# Patient Record
Sex: Male | Born: 1985 | Race: Black or African American | Hispanic: No | Marital: Single | Smoking: Former smoker
Health system: Southern US, Community
[De-identification: ages and names within clinical notes are randomized; demographics above are authoritative.]

## PROBLEM LIST (undated history)

## (undated) DIAGNOSIS — J45909 Unspecified asthma, uncomplicated: Secondary | ICD-10-CM

## (undated) DIAGNOSIS — M1990 Unspecified osteoarthritis, unspecified site: Secondary | ICD-10-CM

## (undated) DIAGNOSIS — S83249A Other tear of medial meniscus, current injury, unspecified knee, initial encounter: Secondary | ICD-10-CM

## (undated) HISTORY — PX: NO PAST SURGERIES: SHX2092

---

## 2010-12-01 ENCOUNTER — Emergency Department (HOSPITAL_COMMUNITY)
Admission: EM | Admit: 2010-12-01 | Discharge: 2010-12-02 | Payer: Self-pay | Source: Home / Self Care | Admitting: Emergency Medicine

## 2010-12-02 IMAGING — CR DG ANKLE COMPLETE 3+V*L*
3 series · 3 of 3 positions shown · non-contrast
Comparison: None.

CLINICAL DATA: Twisted ankle, with medial and lateral left ankle
pain and swelling.

LEFT ANKLE COMPLETE - 3+ VIEW

[t ankle joint ap left]
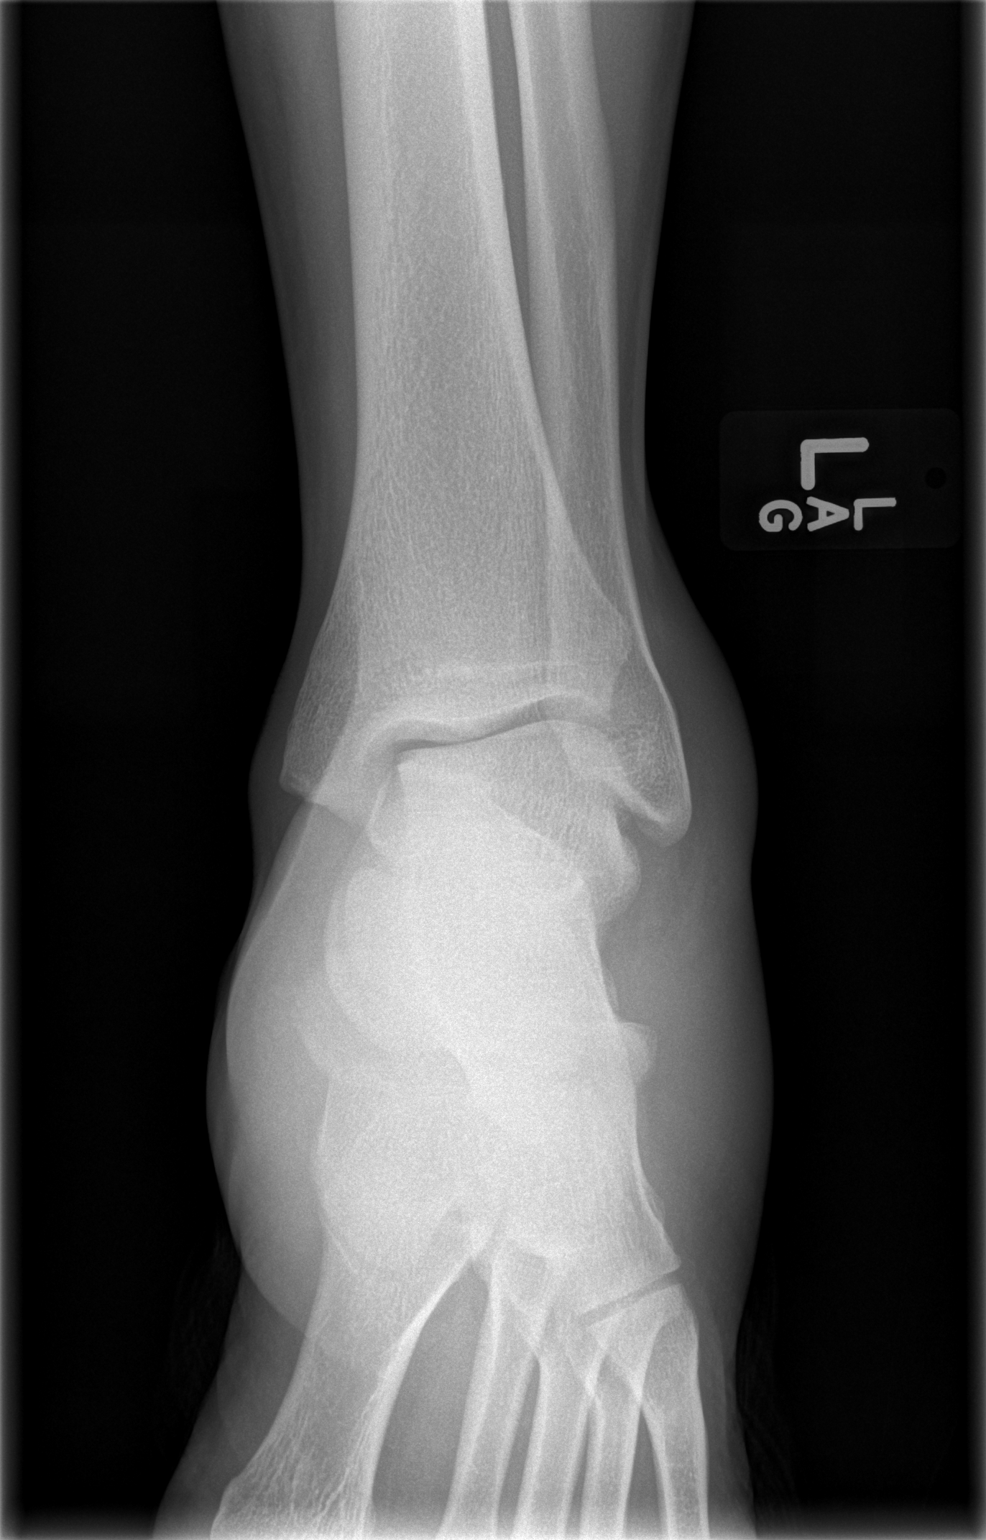

[t ankle joint oblique left]
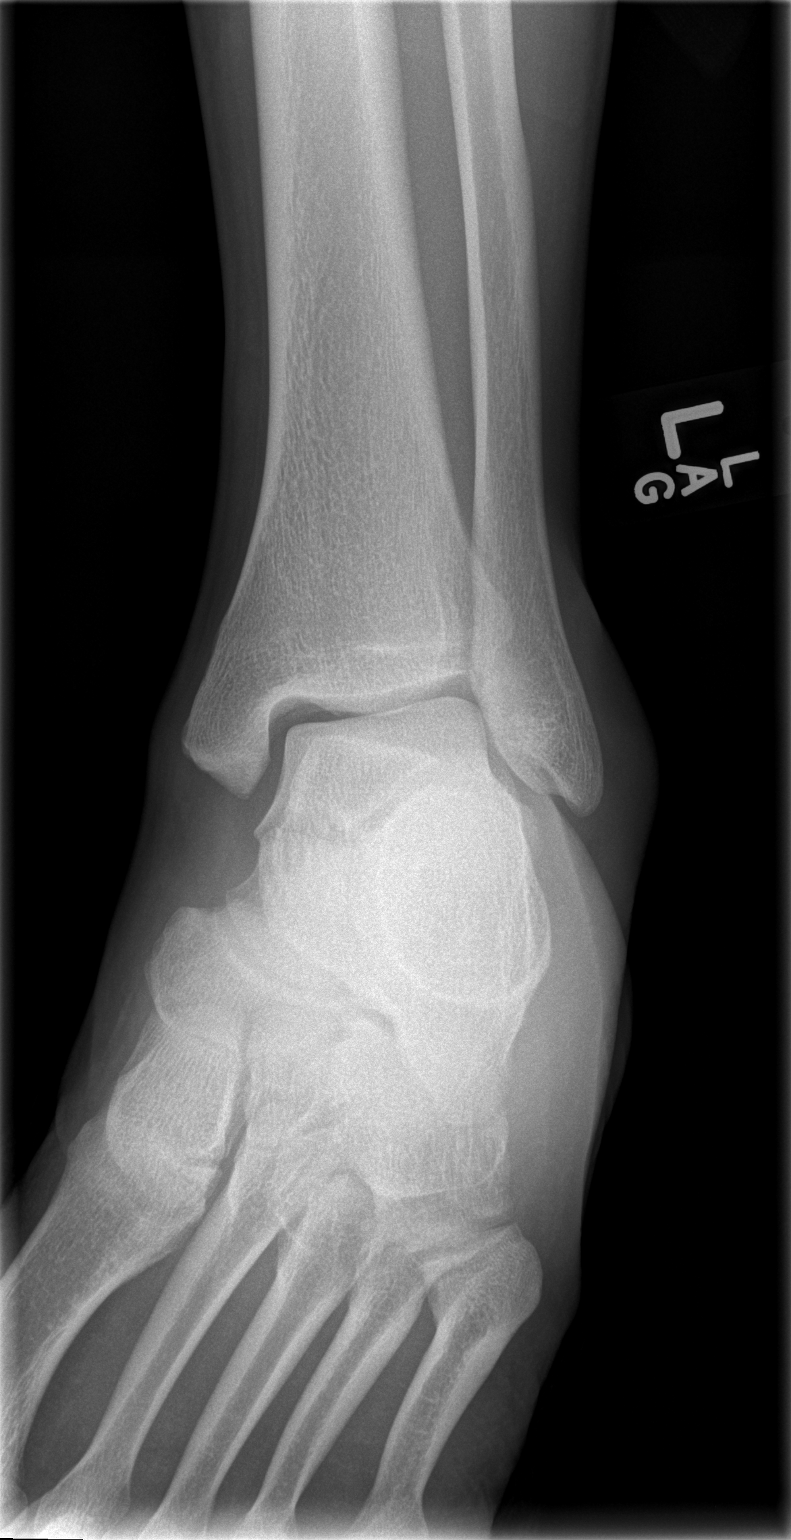

[t ankle joint lat left]
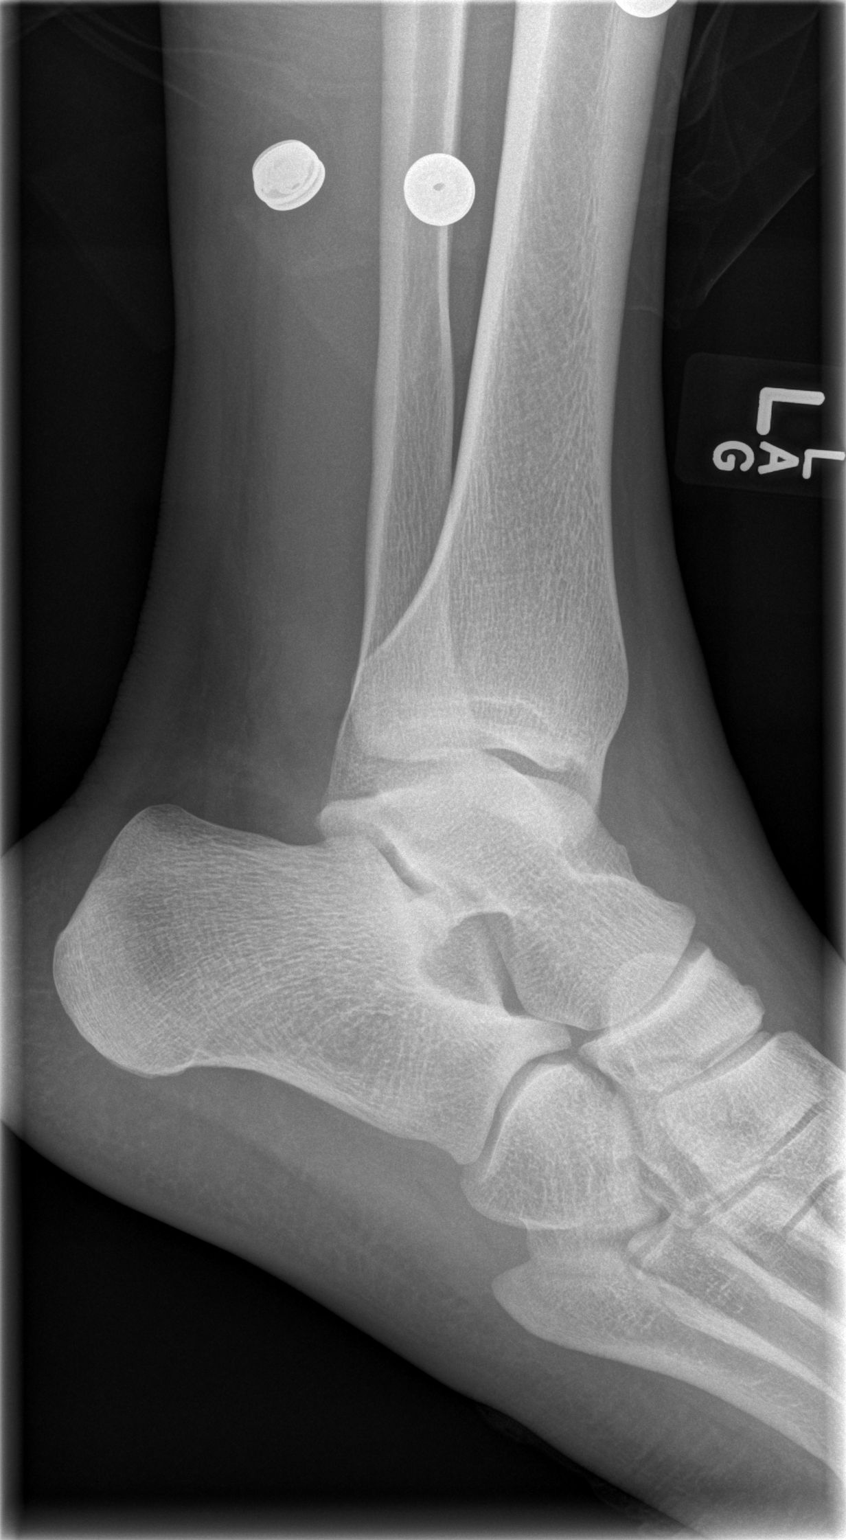

[3 of 3 positions shown; findings below may reference images not displayed]

FINDINGS: There is no evidence of fracture or dislocation.  The
ankle mortise is intact; the interosseous space is within normal
limits.  No talar tilt or subluxation is seen.

The joint spaces are preserved.  Lateral soft tissue swelling is
noted.
IMPRESSION: No evidence of fracture or dislocation.

## 2012-01-26 ENCOUNTER — Encounter (HOSPITAL_COMMUNITY): Payer: Self-pay | Admitting: Emergency Medicine

## 2012-01-26 ENCOUNTER — Emergency Department (HOSPITAL_COMMUNITY)
Admission: EM | Admit: 2012-01-26 | Discharge: 2012-01-27 | Disposition: A | Discharge status: Home / Self Care | Payer: Self-pay | Attending: Emergency Medicine | Admitting: Emergency Medicine

## 2012-01-26 DIAGNOSIS — Z202 Contact with and (suspected) exposure to infections with a predominantly sexual mode of transmission: Secondary | ICD-10-CM | POA: Insufficient documentation

## 2012-01-26 DIAGNOSIS — J45909 Unspecified asthma, uncomplicated: Secondary | ICD-10-CM | POA: Insufficient documentation

## 2012-01-26 DIAGNOSIS — F17200 Nicotine dependence, unspecified, uncomplicated: Secondary | ICD-10-CM | POA: Insufficient documentation

## 2012-01-26 MED ORDER — AZITHROMYCIN 250 MG PO TABS
2000.0000 mg | ORAL_TABLET | Freq: Once | ORAL | Status: AC
  Administered 2012-01-27: 2000 mg via ORAL
  Filled 2012-01-26: qty 8

## 2012-01-26 NOTE — ED Notes (Signed)
Pt alert, nad, c/o pain to penis, onset was today, admits to several sexual partners, resp even unlabored, skin pwd, denies discharge, denies changes in urinary

## 2012-01-27 LAB — GC/CHLAMYDIA PROBE AMP, GENITAL
Chlamydia, DNA Probe: NEGATIVE
GC Probe Amp, Genital: NEGATIVE

## 2012-01-27 NOTE — ED Provider Notes (Signed)
History     CSN: [id number]  Arrival date & time 01/26/12  2149   First MD Initiated Contact with Patient 01/26/12 2342      Chief Complaint  Patient presents with  . SEXUALLY TRANSMITTED DISEASE    (Consider location/radiation/quality/duration/timing/severity/associated sxs/prior treatment) The history is provided by the patient.   penile discomfort today. Patient has had recent sexual contact, unprotected sex. No known STD exposure or history of same. He denies hematuria or dysuria. No testicular pain. No rash. No lesion. No fevers or chills. No nausea or vomiting. No abdominal pain or joint pains. Moderate in severity. No radiation of pain.   Past Medical History  Diagnosis Date  . Asthma     History reviewed. No pertinent past surgical history.  No family history on file.  History  Substance Use Topics  . Smoking status: Current Everyday Smoker -- 1.0 packs/day    Types: Cigarettes  . Smokeless tobacco: Not on file  . Alcohol Use: No      Review of Systems  Constitutional: Negative for fever and chills.  HENT: Negative for neck pain and neck stiffness.   Eyes: Negative for pain.  Respiratory: Negative for shortness of breath.   Cardiovascular: Negative for chest pain.  Gastrointestinal: Negative for abdominal pain.  Genitourinary: Negative for dysuria, flank pain and discharge.  Musculoskeletal: Negative for back pain.  Skin: Negative for rash.  Neurological: Negative for headaches.  All other systems reviewed and are negative.    Allergies  Penicillins  Home Medications  No current outpatient prescriptions on file.  BP 145/77  Pulse 62  Temp(Src) 98.9 F (37.2 C) (Oral)  Resp 20  Wt 229 lb 8 oz (104.1 kg)  SpO2 100%  Physical Exam  Constitutional: He is oriented to person, place, and time. He appears well-developed and well-nourished.  HENT:  Head: Normocephalic and atraumatic.  Eyes: Conjunctivae and EOM are normal. Pupils are equal, round,  and reactive to light.  Neck: Trachea normal. Neck supple. No thyromegaly present.  Cardiovascular: Normal rate, regular rhythm, S1 normal, S2 normal and normal pulses.     No systolic murmur is present   No diastolic murmur is present  Pulses:      Radial pulses are 2+ on the right side, and 2+ on the left side.  Pulmonary/Chest: Effort normal and breath sounds normal. He has no wheezes. He has no rhonchi. He has no rales. He exhibits no tenderness.  Abdominal: Soft. Normal appearance and bowel sounds are normal. There is no tenderness. There is no CVA tenderness and negative Murphy's sign.  Genitourinary:       No penile or testicular tenderness on exam. No discharge visualized.  Musculoskeletal:       BLE:s Calves nontender, no cords or erythema, negative Homans sign  Neurological: He is alert and oriented to person, place, and time. He has normal strength. No cranial nerve deficit or sensory deficit. GCS eye subscore is 4. GCS verbal subscore is 5. GCS motor subscore is 6.  Skin: Skin is warm and dry. No rash noted. He is not diaphoretic.  Psychiatric: His speech is normal.       Cooperative and appropriate    ED Course  Procedures (including critical care time)   Labs Reviewed  GC/CHLAMYDIA PROBE AMP, GENITAL  RPR   GU probe and RPR sent as above  Penicillin allergy noted. azithromycin 2 g by mouth MDM   Patient treated for potential STD. He agrees to followup at the  health department for results of labs sent today. he also agrees to notify all sexual contacts to be evaluated.  No systemic symptoms.        Sunnie Nielsen, MD 01/27/12 [phone]

## 2012-04-20 ENCOUNTER — Emergency Department (HOSPITAL_COMMUNITY)
Admission: EM | Admit: 2012-04-20 | Discharge: 2012-04-20 | Disposition: A | Discharge status: Home / Self Care | Payer: Self-pay | Attending: Emergency Medicine | Admitting: Emergency Medicine

## 2012-04-20 ENCOUNTER — Emergency Department (HOSPITAL_COMMUNITY): Payer: Self-pay

## 2012-04-20 ENCOUNTER — Encounter (HOSPITAL_COMMUNITY): Payer: Self-pay | Admitting: Emergency Medicine

## 2012-04-20 DIAGNOSIS — W19XXXA Unspecified fall, initial encounter: Secondary | ICD-10-CM | POA: Insufficient documentation

## 2012-04-20 DIAGNOSIS — Z87891 Personal history of nicotine dependence: Secondary | ICD-10-CM | POA: Insufficient documentation

## 2012-04-20 DIAGNOSIS — J45909 Unspecified asthma, uncomplicated: Secondary | ICD-10-CM | POA: Insufficient documentation

## 2012-04-20 DIAGNOSIS — S39012A Strain of muscle, fascia and tendon of lower back, initial encounter: Secondary | ICD-10-CM

## 2012-04-20 DIAGNOSIS — S335XXA Sprain of ligaments of lumbar spine, initial encounter: Secondary | ICD-10-CM | POA: Insufficient documentation

## 2012-04-20 IMAGING — CR DG LUMBAR SPINE COMPLETE 4+V
5 series · 5 of 5 positions shown · non-contrast
Comparison: None.

CLINICAL DATA: Fell and injured low back.

LUMBAR SPINE - COMPLETE 4+ VIEW [DATE]:

[t lumbar spine ap]
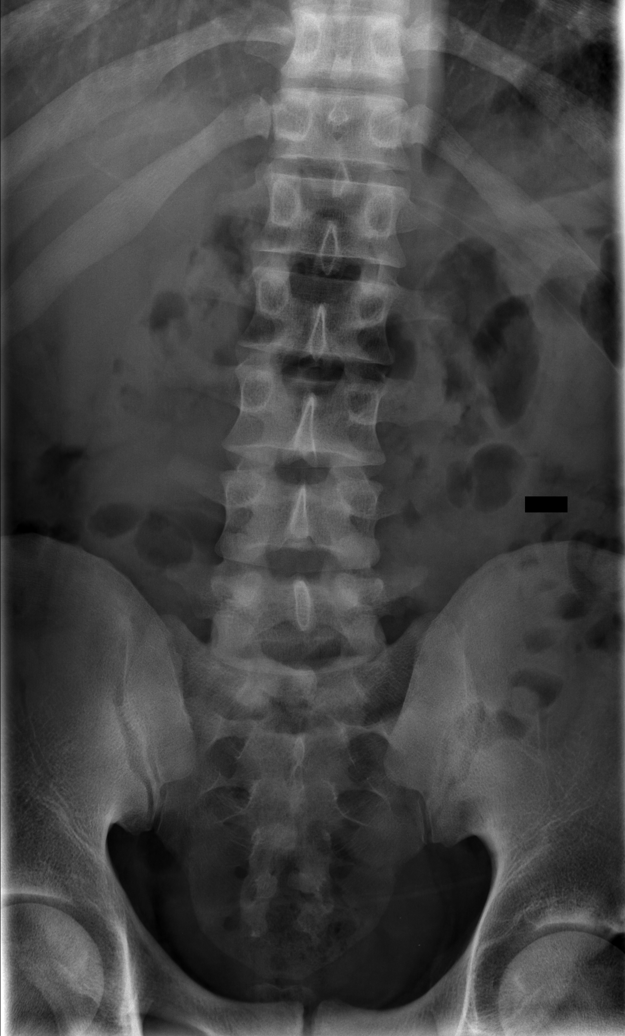

[t lumbar spine obl (1 of 2)]
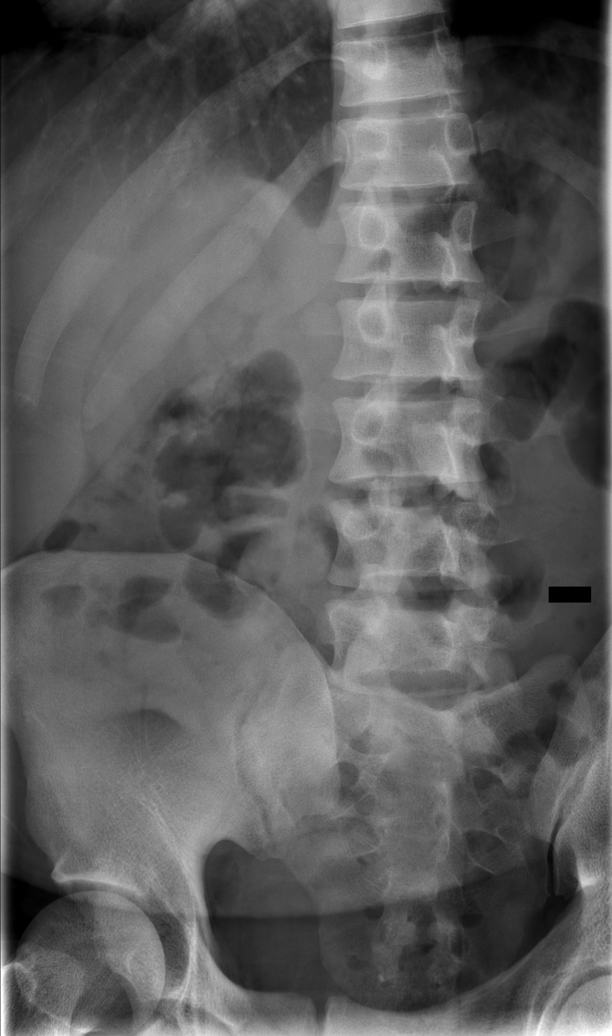

[t lumbar spine obl (2 of 2)]
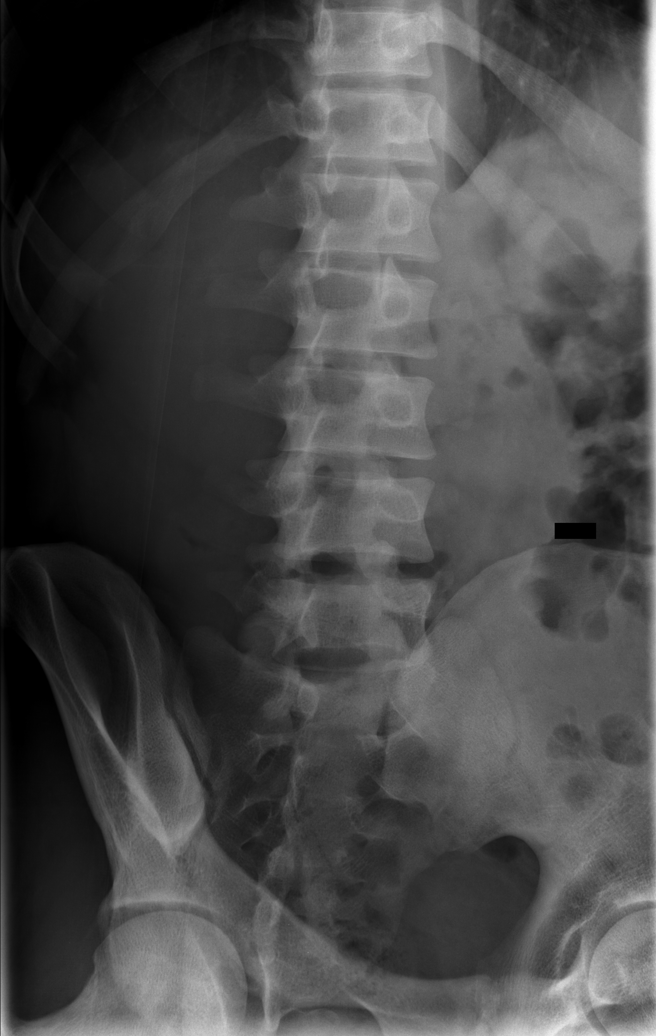

[t lumbar spine lat]
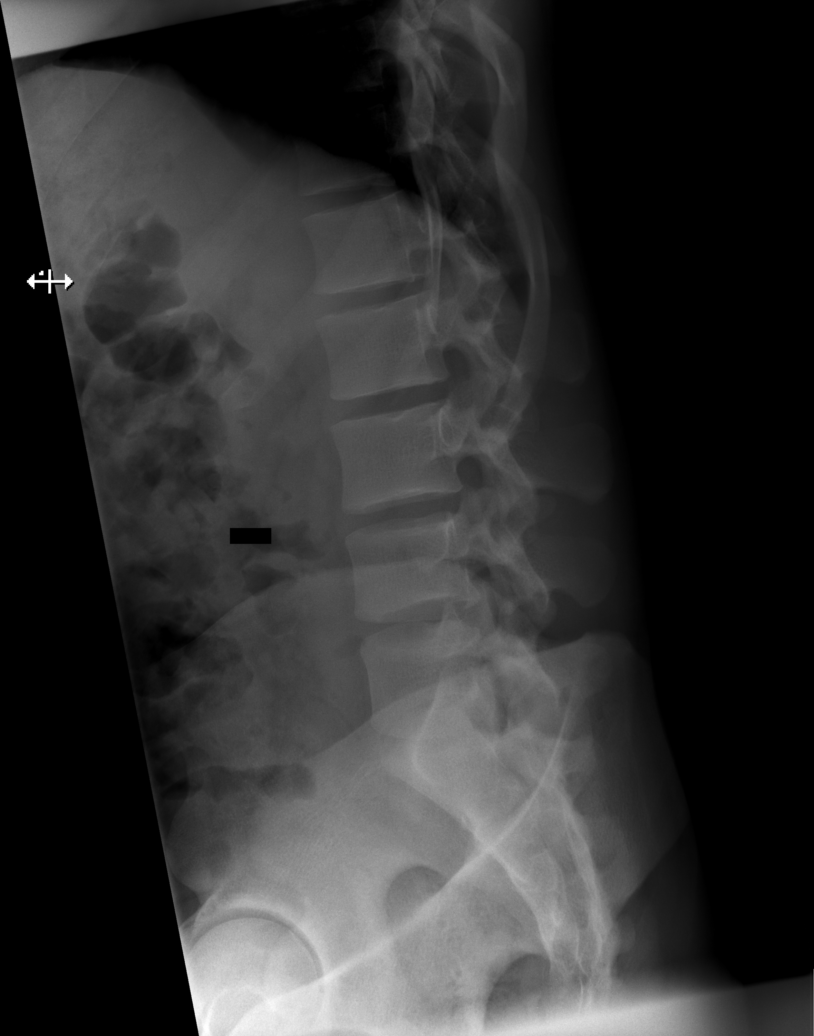

[t lumbar l-5 s-1 spot]
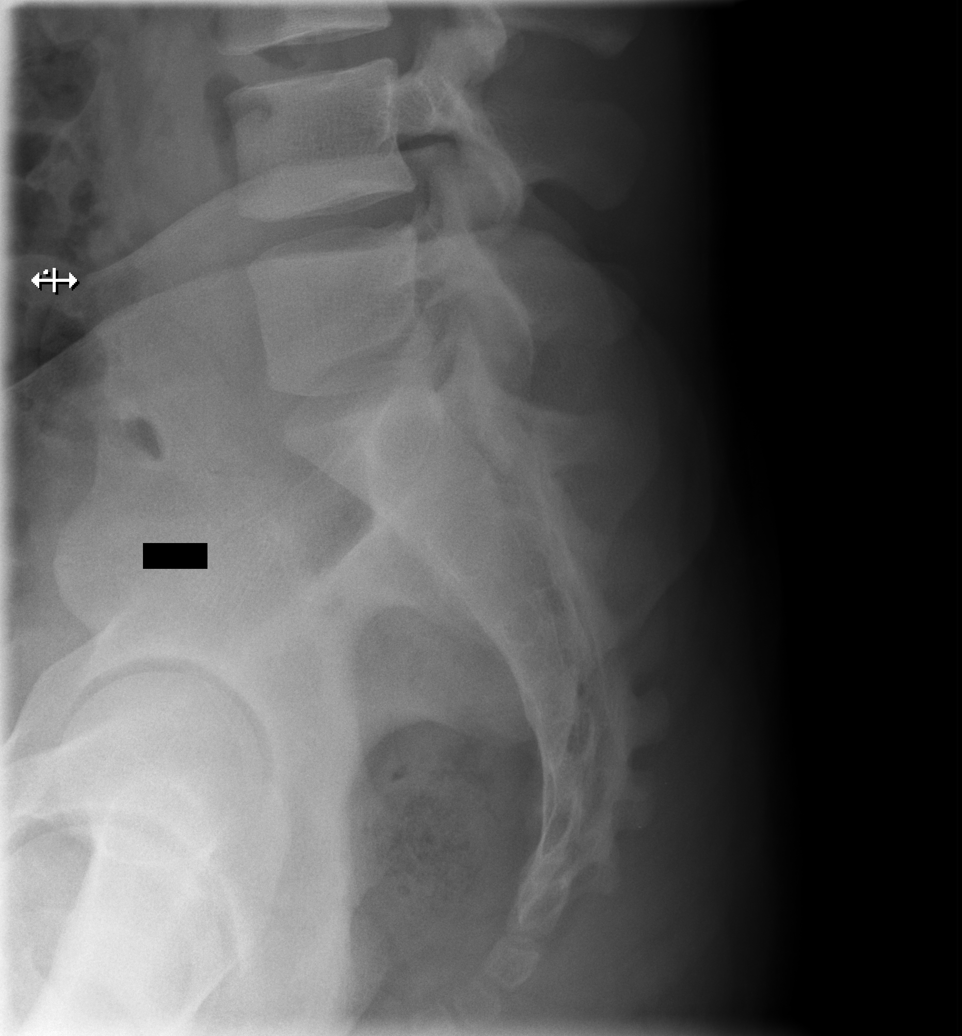

[5 of 5 positions shown; findings below may reference images not displayed]

FINDINGS: Five non-rib bearing lumbar vertebrae with anatomic
alignment.  Straightening of the usual lumbar lordosis.  No
fractures.  Well-preserved disc spaces.  No pars defects.  No
significant facet arthropathy.  Visualized sacroiliac joints
intact.
IMPRESSION: Straightening of the usual lumbar lordosis which may reflect
positioning and/or spasm.  Otherwise normal examination.

## 2012-04-20 MED ORDER — IBUPROFEN 800 MG PO TABS: 800.0000 mg | ORAL_TABLET | Freq: Three times a day (TID) | ORAL | Status: AC | PRN

## 2012-04-20 MED ORDER — CYCLOBENZAPRINE HCL 10 MG PO TABS: 10.0000 mg | ORAL_TABLET | Freq: Three times a day (TID) | ORAL | Status: AC | PRN

## 2012-04-20 MED ORDER — HYDROCODONE-ACETAMINOPHEN 5-325 MG PO TABS: 1.0000 | ORAL_TABLET | Freq: Four times a day (QID) | ORAL | Status: AC | PRN

## 2012-04-20 NOTE — ED Provider Notes (Signed)
History     CSN: [id number]  Arrival date & time 04/20/12  1730   First MD Initiated Contact with Patient 04/20/12 1848      Chief Complaint  Patient presents with  . Back Pain    (Consider location/radiation/quality/duration/timing/severity/associated sxs/prior treatment) HPI Patient presents emergency Dept. with lower back pain following an injury while playing basketball yesterday.  Patient states that he fell, landing on his buttocks and lower back.  He states that he took Tylenol with no relief.  Patient denies abdominal pain, nausea/vomiting, numbness, or weakness in his extremities.  Patient states the pain does not seem to radiate.  Patient pain is located in the right lower lateral back. Past Medical History  Diagnosis Date  . Asthma     History reviewed. No pertinent past surgical history.  No family history on file.  History  Substance Use Topics  . Smoking status: Former Smoker -- 1.0 packs/day    Types: Cigarettes  . Smokeless tobacco: Not on file  . Alcohol Use: No      Review of Systems All other systems negative except as documented in the HPI. All pertinent positives and negatives as reviewed in the HPI.  Allergies  Penicillins  Home Medications  No current outpatient prescriptions on file.  BP 131/75  Pulse 57  Temp(Src) 98.5 F (36.9 C) (Oral)  Resp 20  Wt 230 lb (104.327 kg)  SpO2 100%  Physical Exam Physical Examination: General appearance - alert, well appearing, and in no distress, oriented to person, place, and time and normal appearing weight Chest - clear to auscultation, no wheezes, rales or rhonchi, symmetric air entry, no tachypnea, retractions or cyanosis Heart - normal rate, regular rhythm, normal S1, S2, no murmurs, rubs, clicks or gallops Back exam - full range of motion,  tenderness noted right lateral lower back, there is palpable spasm noted, normal reflexes and strength bilateral lower extremities, sensory exam intact  bilateral lower extremities Neurological - alert, oriented, normal speech, no focal findings or movement disorder noted, DTR's normal and symmetric, motor and sensory grossly normal bilaterally, normal muscle tone, no tremors, strength 5/5  ED Course  Procedures (including critical care time)   Patient does not have any neurological deficits and has normal deep tendon reflexes.  Patient's x-rays are negative for bony injury.  The patient will be treated for lumbar strain, based on the history of present illness and physical exam finding.  Told to return here as needed from worsening his condition.  Ice and heat on his lower back.  MDM   MDM Reviewed: nursing note and vitals Interpretation: x-ray           Carlyle Dolly, PA-C 04/20/12 2021

## 2012-04-20 NOTE — Discharge Instructions (Signed)
Use ice and heat on your back. Your x-rays did not show any abnormalities. Return here as needed.

## 2012-04-20 NOTE — ED Notes (Signed)
Pt. Fell yesterday while playing basketball and injured back.  He took tylenol with no relief.  No numbness or tingling in extremities.  Pain is worse with standing and patient describes it as sharp.

## 2012-04-21 NOTE — ED Provider Notes (Signed)
Medical screening examination/treatment/procedure(s) were performed by non-physician practitioner and as supervising physician I was immediately available for consultation/collaboration.  Rhiley Tarver, MD 04/21/12 0002 

## 2014-09-23 IMAGING — US US SCROTUM
1 series · 14 of 25 positions shown · non-contrast
Comparison: None.

CLINICAL DATA: Left scrotal pain for 1 week.

EXAM:
SCROTAL ULTRASOUND
DOPPLER ULTRASOUND OF THE TESTICLES
TECHNIQUE: Complete ultrasound examination of the testicles, epididymis, and
other scrotal structures was performed. Color and spectral Doppler
ultrasound were also utilized to evaluate blood flow to the
testicles.

[Series 1: us scrotum · 0.07mm/px · 14 of 41 slices shown]
[im 1/41]
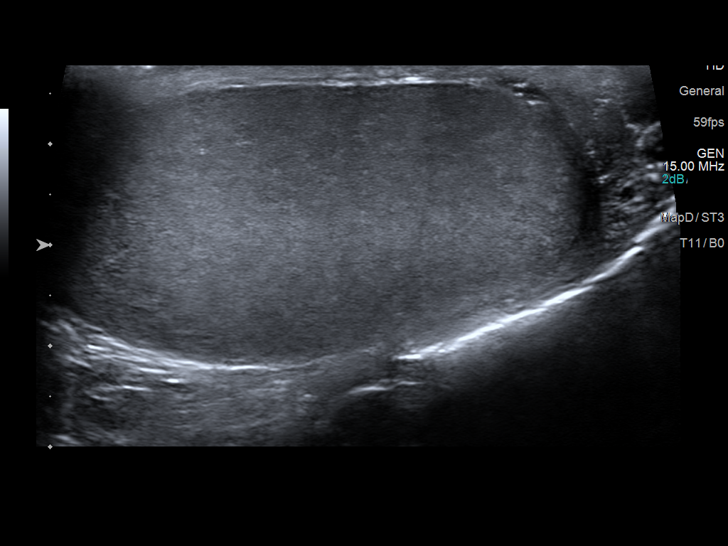
[im 4/41]
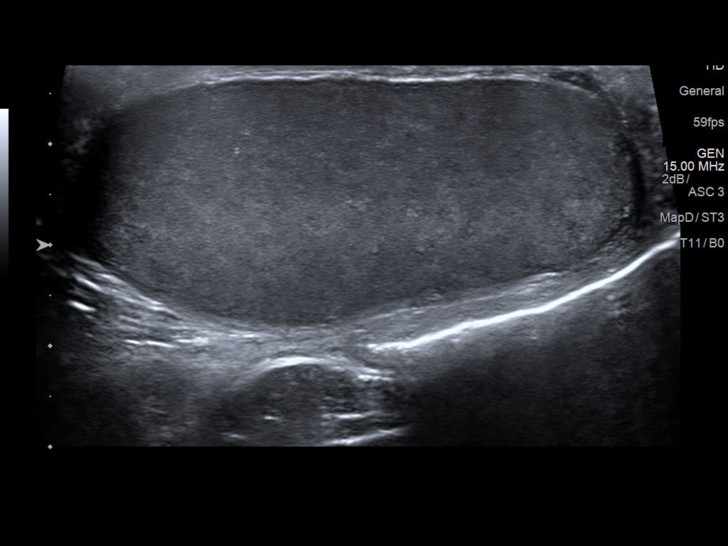
[im 7/41]
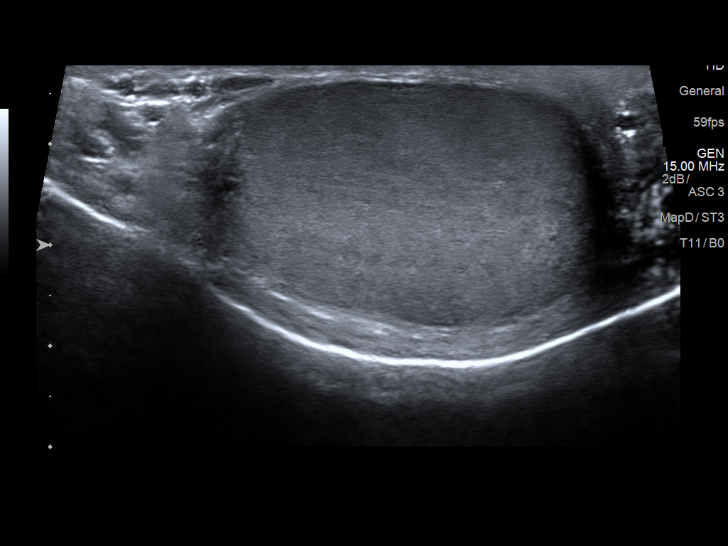
[im 11/41]
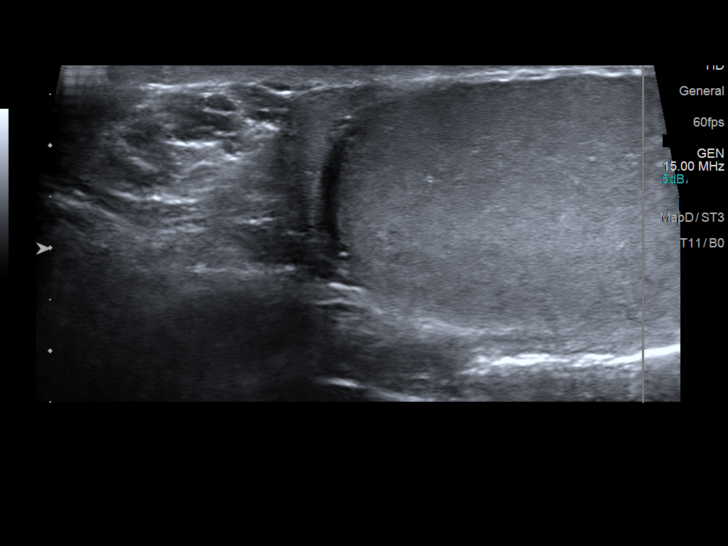
[im 14/41]
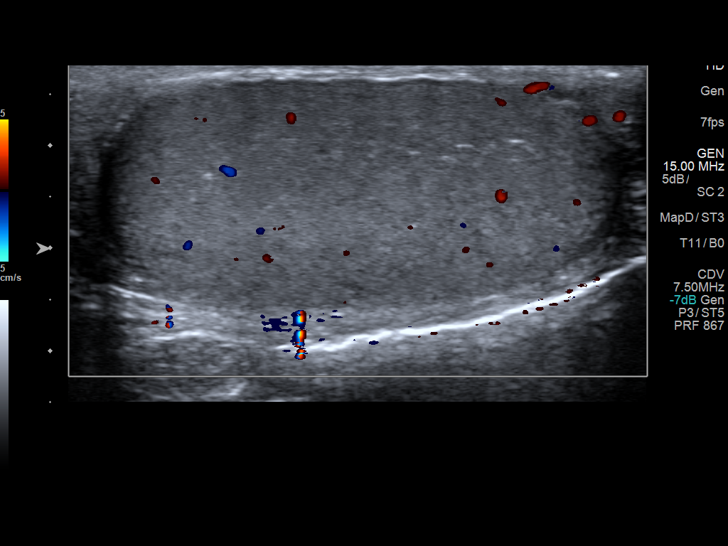
[im 16/41]
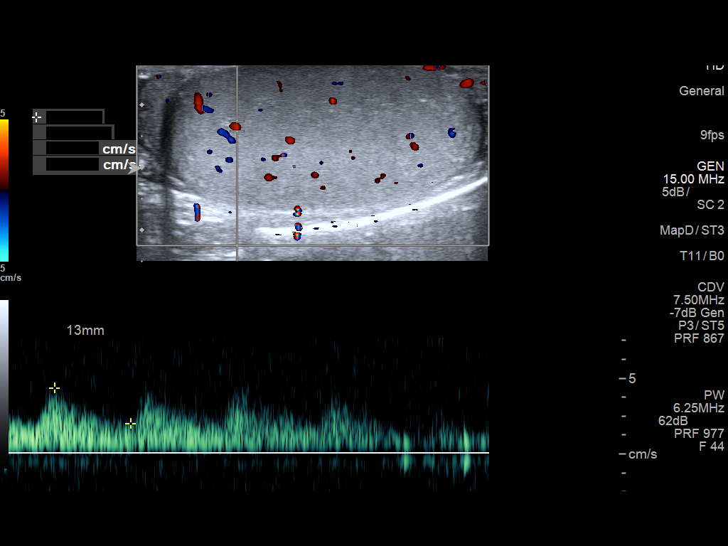
[im 19/41]
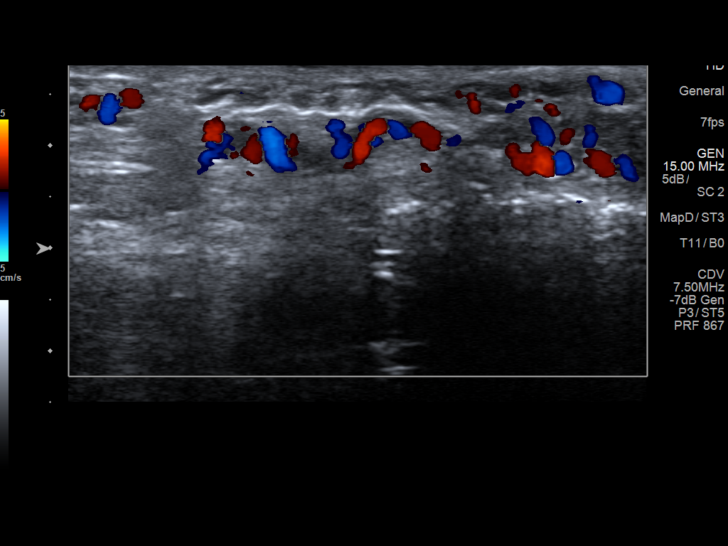
[im 22/41]
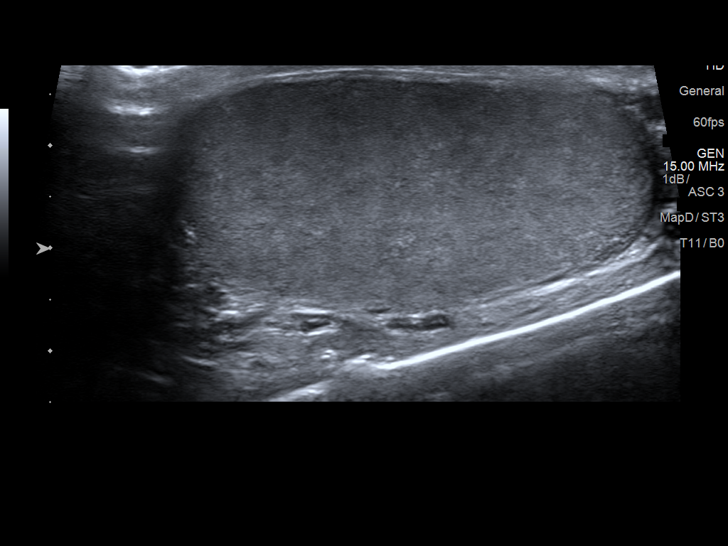
[im 26/41]
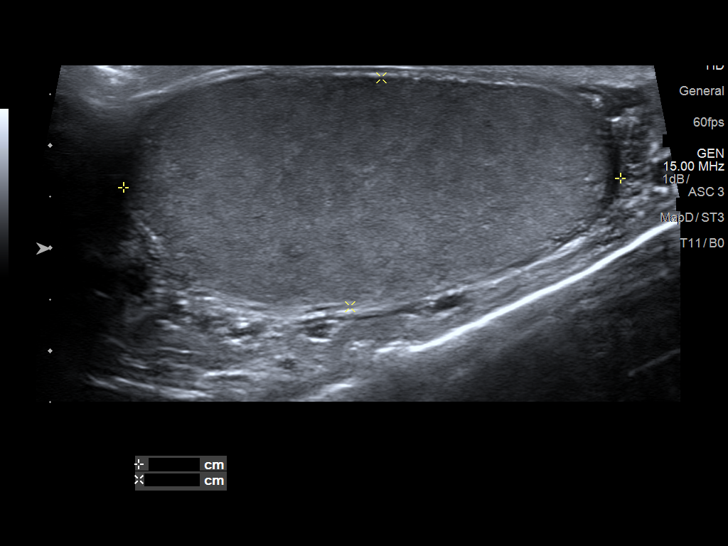
[im 27/41]
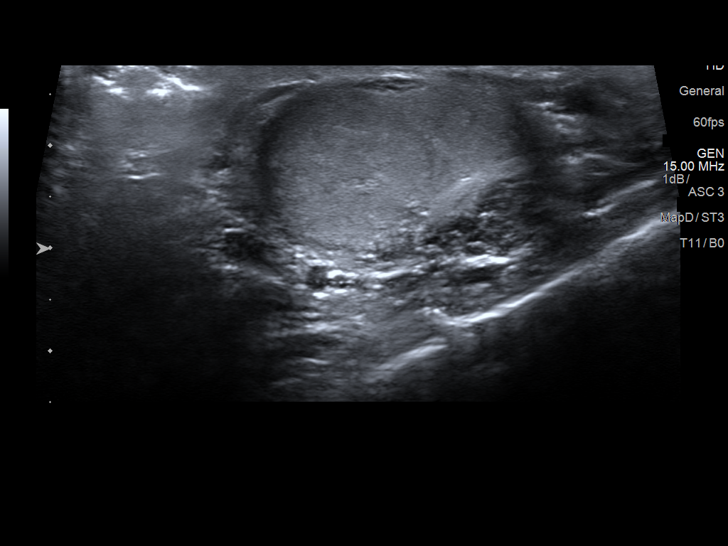
[im 31/41]
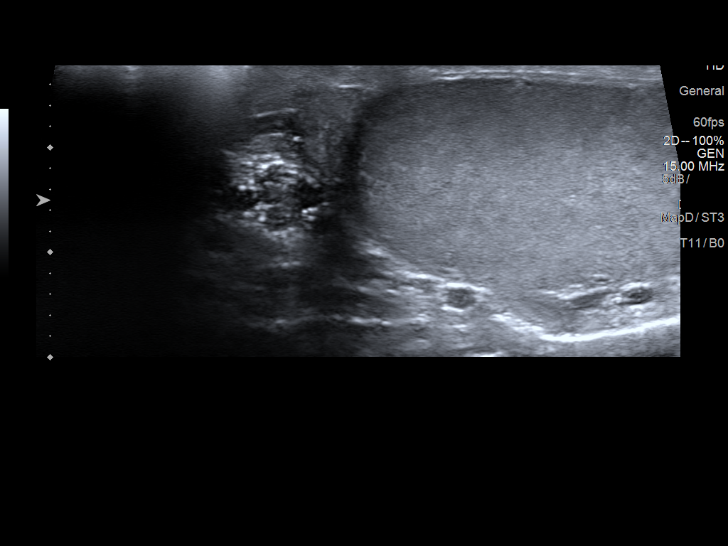
[im 34/41]
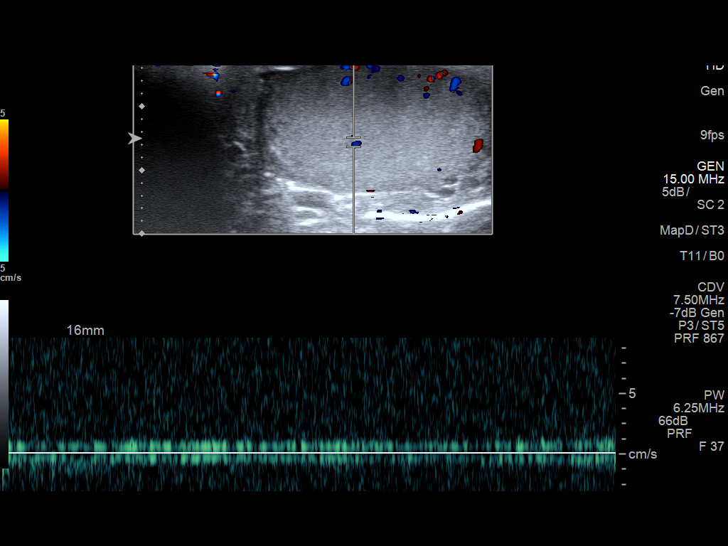
[im 37/41]
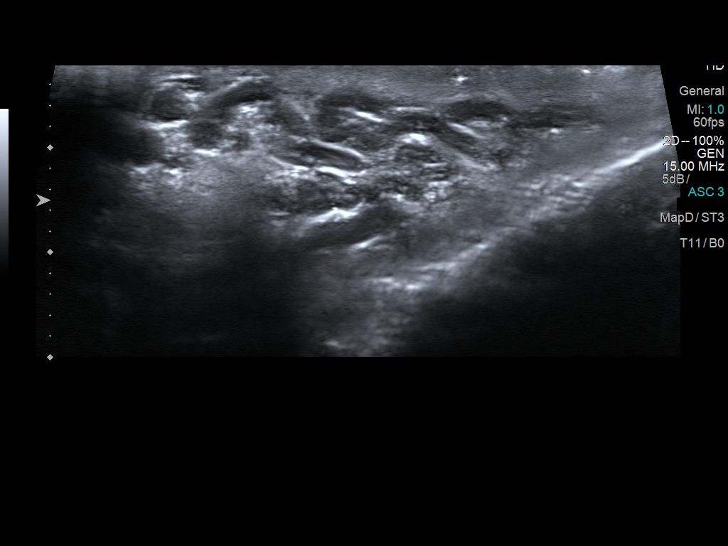
[im 41/41]
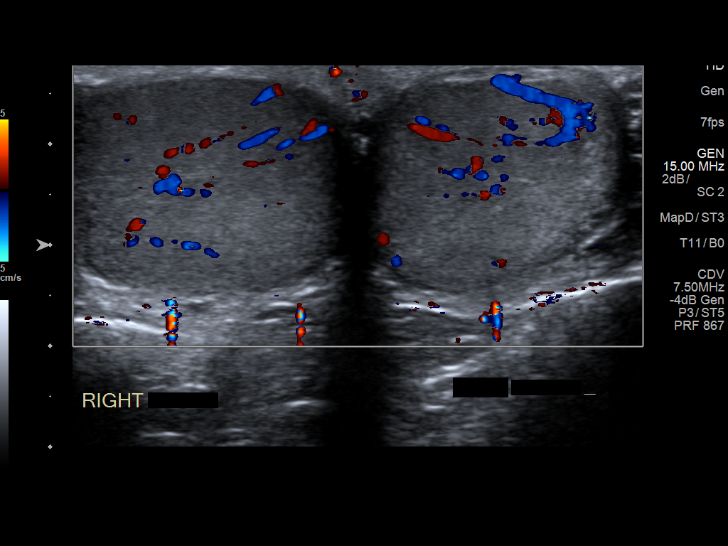

[14 of 25 positions shown; findings below may reference images not displayed]

FINDINGS: Right testicle

Measurements: 5.4 x 2.4 x 3.6 cm, within normal limits. No mass or
microlithiasis visualized.

Left testicle

Measurements: 4.8 x 2.3 x 3.6 cm, within normal limits. No mass or
microlithiasis visualized.

Right epididymis:  Normal in size and appearance.

Left epididymis:  Normal in size and appearance.

Hydrocele:  None visualized.

Varicocele: A left-sided varicocele is present. Veins dilate from
1.8 mm up to 2.3 mm with Valsalva maneuvers no mass lesion is
present.

Pulsed Doppler interrogation of both testes demonstrates normal low
resistance arterial and venous waveforms bilaterally.
IMPRESSION: 1. Left-sided varicocele.
2. Normal appearance of the testicles bilaterally.
3. Arterial and venous waveform analysis is normal bilaterally
without evidence for torsion.

## 2015-08-09 ENCOUNTER — Emergency Department (HOSPITAL_COMMUNITY)
Admission: EM | Admit: 2015-08-09 | Discharge: 2015-08-09 | Disposition: A | Discharge status: Home / Self Care | Payer: Self-pay | Attending: Emergency Medicine | Admitting: Emergency Medicine

## 2015-08-09 ENCOUNTER — Encounter (HOSPITAL_COMMUNITY): Payer: Self-pay | Admitting: *Deleted

## 2015-08-09 DIAGNOSIS — W500XXA Accidental hit or strike by another person, initial encounter: Secondary | ICD-10-CM | POA: Insufficient documentation

## 2015-08-09 DIAGNOSIS — Z87891 Personal history of nicotine dependence: Secondary | ICD-10-CM | POA: Insufficient documentation

## 2015-08-09 DIAGNOSIS — J45909 Unspecified asthma, uncomplicated: Secondary | ICD-10-CM | POA: Insufficient documentation

## 2015-08-09 DIAGNOSIS — Z23 Encounter for immunization: Secondary | ICD-10-CM | POA: Insufficient documentation

## 2015-08-09 DIAGNOSIS — S01112A Laceration without foreign body of left eyelid and periocular area, initial encounter: Secondary | ICD-10-CM | POA: Insufficient documentation

## 2015-08-09 DIAGNOSIS — S0181XA Laceration without foreign body of other part of head, initial encounter: Secondary | ICD-10-CM

## 2015-08-09 DIAGNOSIS — Y92310 Basketball court as the place of occurrence of the external cause: Secondary | ICD-10-CM | POA: Insufficient documentation

## 2015-08-09 DIAGNOSIS — Y998 Other external cause status: Secondary | ICD-10-CM | POA: Insufficient documentation

## 2015-08-09 DIAGNOSIS — Z880 Allergy status to penicillin: Secondary | ICD-10-CM | POA: Insufficient documentation

## 2015-08-09 DIAGNOSIS — Y9367 Activity, basketball: Secondary | ICD-10-CM | POA: Insufficient documentation

## 2015-08-09 MED ORDER — TETANUS-DIPHTH-ACELL PERTUSSIS 5-2.5-18.5 LF-MCG/0.5 IM SUSP
0.5000 mL | Freq: Once | INTRAMUSCULAR | Status: AC
  Administered 2015-08-09: 0.5 mL via INTRAMUSCULAR
  Filled 2015-08-09: qty 0.5

## 2015-08-09 MED ORDER — LIDOCAINE-EPINEPHRINE (PF) 2 %-1:200000 IJ SOLN
20.0000 mL | Freq: Once | INTRAMUSCULAR | Status: AC
  Administered 2015-08-09: 20 mL
  Filled 2015-08-09: qty 20

## 2015-08-09 NOTE — ED Provider Notes (Signed)
CSN: [id number]     Arrival date & time 08/09/15  2026 History  This chart was scribed for non-physician practitioner, Tommy Rainwater, working with Derwood Kaplan, MD by Freida Busman, ED Scribe. This patient was seen in room WTR7/WTR7 and the patient's care was started at 10:36 PM.    Chief Complaint  Patient presents with  . Facial Laceration    The history is provided by the patient. No language interpreter was used.    HPI Comments:  Kurt Glass is a 29 y.o. male who presents to the Emergency Department complaining of a  laceration above his left eye. Pt sustained the injury PTA; states he collided with another player while playing basketball. He deneis LOC. Tetanus is not UTD. No alleviating factors or associated symptoms noted.   Past Medical History  Diagnosis Date  . Asthma    History reviewed. No pertinent past surgical history. No family history on file. Social History  Substance Use Topics  . Smoking status: Former Smoker -- 1.00 packs/day    Types: Cigarettes  . Smokeless tobacco: None  . Alcohol Use: No    Review of Systems  Constitutional: Negative for fever.  Skin: Positive for wound.       Laceration   Neurological: Negative for syncope.      Allergies  Penicillins  Home Medications   Prior to Admission medications   Not on File   There were no vitals taken for this visit. Physical Exam  Constitutional: He appears well-developed and well-nourished. No distress.  HENT:  Head: Normocephalic and atraumatic.  No facial bony tenderness.   Eyes: Conjunctivae are normal.  FROM without pain.  Neck: Normal range of motion.  Cardiovascular: Normal rate.   Pulmonary/Chest: Effort normal.  Musculoskeletal: Normal range of motion.  No midline cervical tenderness.   Neurological: He is alert.  Skin: Skin is warm and dry.  2 cm linear laceration medial left eyebrow.   Nursing note and vitals reviewed.   ED Course  Procedures    DIAGNOSTIC STUDIES:  Oxygen Saturation is 100% on RA, normal by my interpretation.    COORDINATION OF CARE:  10:39 PM Discussed treatment plan with pt at bedside and pt agreed to plan.  Labs Review Labs Reviewed - No data to display  Imaging Review No results found. I have personally reviewed and evaluated these images and lab results as part of my medical decision-making.   EKG Interpretation None     LACERATION REPAIR Performed by: Elpidio Anis A Authorized by: Elpidio Anis A Consent: Verbal consent obtained. Risks and benefits: risks, benefits and alternatives were discussed Consent given by: patient Patient identity confirmed: provided demographic data Prepped and Draped in normal sterile fashion Wound explored  Laceration Location: medial left eyebrow  Laceration Length: 2 cm  No Foreign Bodies seen or palpated  Anesthesia: local infiltration  Local anesthetic: lidocaine 2% w/epinephrine  Anesthetic total: 1 ml  Irrigation method: syringe Amount of cleaning: standard  Skin closure: 6-0 prolene  Number of sutures: 5  Technique: running   Patient tolerance: Patient tolerated the procedure well with no immediate complications.  MDM   Final diagnoses:  None    1. Facial laceration  Uncomplicated facial laceration, repaired as above note. No bony or direct eye injury. Tetanus updated.  I personally performed the services described in this documentation, which was scribed in my presence. The recorded information has been reviewed and is accurate.     Elpidio Anis, PA-C 08/09/15  [id number]  Derwood Kaplan, MD 08/10/15 1700

## 2015-08-09 NOTE — Discharge Instructions (Signed)
Facial Laceration  A facial laceration is a cut on the face. These injuries can be painful and cause bleeding. Lacerations usually heal quickly, but they need special care to reduce scarring. DIAGNOSIS  Your health care provider will take a medical history, ask for details about how the injury occurred, and examine the wound to determine how deep the cut is. TREATMENT  Some facial lacerations may not require closure. Others may not be able to be closed because of an increased risk of infection. The risk of infection and the chance for successful closure will depend on various factors, including the amount of time since the injury occurred. The wound may be cleaned to help prevent infection. If closure is appropriate, pain medicines may be given if needed. Your health care provider will use stitches (sutures), wound glue (adhesive), or skin adhesive strips to repair the laceration. These tools bring the skin edges together to allow for faster healing and a better cosmetic outcome. If needed, you may also be given a tetanus shot. HOME CARE INSTRUCTIONS  Only take over-the-counter or prescription medicines as directed by your health care provider.  Follow your health care provider's instructions for wound care. These instructions will vary depending on the technique used for closing the wound. For Sutures:  Keep the wound clean and dry.   If you were given a bandage (dressing), you should change it at least once a day. Also change the dressing if it becomes wet or dirty, or as directed by your health care provider.   Wash the wound with soap and water 2 times a day. Rinse the wound off with water to remove all soap. Pat the wound dry with a clean towel.   After cleaning, apply a thin layer of the antibiotic ointment recommended by your health care provider. This will help prevent infection and keep the dressing from sticking.   You may shower as usual after the first 24 hours. Do not soak the  wound in water until the sutures are removed.   Get your sutures removed as directed by your health care provider. With facial lacerations, sutures should usually be taken out after 4-5 days to avoid stitch marks.   Wait a few days after your sutures are removed before applying any makeup. For Skin Adhesive Strips:  Keep the wound clean and dry.   Do not get the skin adhesive strips wet. You may bathe carefully, using caution to keep the wound dry.   If the wound gets wet, pat it dry with a clean towel.   Skin adhesive strips will fall off on their own. You may trim the strips as the wound heals. Do not remove skin adhesive strips that are still stuck to the wound. They will fall off in time.  For Wound Adhesive:  You may briefly wet your wound in the shower or bath. Do not soak or scrub the wound. Do not swim. Avoid periods of heavy sweating until the skin adhesive has fallen off on its own. After showering or bathing, gently pat the wound dry with a clean towel.   Do not apply liquid medicine, cream medicine, ointment medicine, or makeup to your wound while the skin adhesive is in place. This may loosen the film before your wound is healed.   If a dressing is placed over the wound, be careful not to apply tape directly over the skin adhesive. This may cause the adhesive to be pulled off before the wound is healed.   Avoid   prolonged exposure to sunlight or tanning lamps while the skin adhesive is in place.  The skin adhesive will usually remain in place for 5-10 days, then naturally fall off the skin. Do not pick at the adhesive film.  After Healing: Once the wound has healed, cover the wound with sunscreen during the day for 1 full year. This can help minimize scarring. Exposure to ultraviolet light in the first year will darken the scar. It can take 1-2 years for the scar to lose its redness and to heal completely.  SEEK IMMEDIATE MEDICAL CARE IF:  You have redness, pain, or  swelling around the wound.   You see ayellowish-white fluid (pus) coming from the wound.   You have chills or a fever.  MAKE SURE YOU:  Understand these instructions.  Will watch your condition.  Will get help right away if you are not doing well or get worse. Document Released: 01/12/2005 Document Revised: 09/25/2013 Document Reviewed: 07/18/2013 [employer] Patient Information 2015 [employer]. This information is not intended to replace advice given to you by your health care provider. Make sure you discuss any questions you have with your health care provider.  

## 2015-08-09 NOTE — ED Notes (Signed)
Pt reports he was playing basketball tonight, dove for the ball and had a head on collision with another player.  Lac to L eyebrow noted.  Pt reports he felt dizzy but denies LOC at this time.

## 2015-12-30 DIAGNOSIS — J45909 Unspecified asthma, uncomplicated: Secondary | ICD-10-CM | POA: Insufficient documentation

## 2015-12-30 DIAGNOSIS — F17210 Nicotine dependence, cigarettes, uncomplicated: Secondary | ICD-10-CM | POA: Insufficient documentation

## 2015-12-30 DIAGNOSIS — R109 Unspecified abdominal pain: Secondary | ICD-10-CM | POA: Insufficient documentation

## 2015-12-31 ENCOUNTER — Encounter (HOSPITAL_COMMUNITY): Payer: Self-pay | Admitting: Emergency Medicine

## 2015-12-31 ENCOUNTER — Encounter (HOSPITAL_COMMUNITY): Payer: Self-pay | Admitting: *Deleted

## 2015-12-31 ENCOUNTER — Emergency Department (HOSPITAL_COMMUNITY)
Admission: EM | Admit: 2015-12-31 | Discharge: 2015-12-31 | Discharge status: Against Medical Advice | Payer: Self-pay | Attending: Emergency Medicine | Admitting: Emergency Medicine

## 2015-12-31 ENCOUNTER — Emergency Department (HOSPITAL_COMMUNITY): Payer: Self-pay

## 2015-12-31 ENCOUNTER — Emergency Department (HOSPITAL_COMMUNITY)
Admission: EM | Admit: 2015-12-31 | Discharge: 2015-12-31 | Disposition: A | Discharge status: Home / Self Care | Payer: Self-pay | Attending: Emergency Medicine | Admitting: Emergency Medicine

## 2015-12-31 DIAGNOSIS — N50812 Left testicular pain: Secondary | ICD-10-CM

## 2015-12-31 DIAGNOSIS — R109 Unspecified abdominal pain: Secondary | ICD-10-CM | POA: Insufficient documentation

## 2015-12-31 DIAGNOSIS — Z880 Allergy status to penicillin: Secondary | ICD-10-CM | POA: Insufficient documentation

## 2015-12-31 DIAGNOSIS — I861 Scrotal varices: Secondary | ICD-10-CM | POA: Insufficient documentation

## 2015-12-31 DIAGNOSIS — R110 Nausea: Secondary | ICD-10-CM | POA: Insufficient documentation

## 2015-12-31 DIAGNOSIS — F17210 Nicotine dependence, cigarettes, uncomplicated: Secondary | ICD-10-CM | POA: Insufficient documentation

## 2015-12-31 DIAGNOSIS — J45909 Unspecified asthma, uncomplicated: Secondary | ICD-10-CM | POA: Insufficient documentation

## 2015-12-31 DIAGNOSIS — N50819 Testicular pain, unspecified: Secondary | ICD-10-CM

## 2015-12-31 LAB — URINALYSIS, ROUTINE W REFLEX MICROSCOPIC
BILIRUBIN URINE: NEGATIVE
GLUCOSE, UA: NEGATIVE mg/dL
HGB URINE DIPSTICK: NEGATIVE
KETONES UR: NEGATIVE mg/dL
Nitrite: NEGATIVE
PH: 6 (ref 5.0–8.0)
PROTEIN: NEGATIVE mg/dL
Specific Gravity, Urine: 1.027 (ref 1.005–1.030)

## 2015-12-31 LAB — URINE MICROSCOPIC-ADD ON: Squamous Epithelial / LPF: NONE SEEN

## 2015-12-31 MED ORDER — STERILE WATER FOR INJECTION IJ SOLN
INTRAMUSCULAR | Status: AC
  Administered 2015-12-31: 10 mL
  Filled 2015-12-31: qty 10

## 2015-12-31 MED ORDER — OXYCODONE-ACETAMINOPHEN 5-325 MG PO TABS
1.0000 | ORAL_TABLET | Freq: Once | ORAL | Status: AC
  Administered 2015-12-31: 1 via ORAL
  Filled 2015-12-31: qty 1

## 2015-12-31 MED ORDER — CEFTRIAXONE SODIUM 250 MG IJ SOLR
250.0000 mg | Freq: Once | INTRAMUSCULAR | Status: AC
  Administered 2015-12-31: 250 mg via INTRAMUSCULAR
  Filled 2015-12-31: qty 250

## 2015-12-31 MED ORDER — AZITHROMYCIN 250 MG PO TABS
1000.0000 mg | ORAL_TABLET | Freq: Once | ORAL | Status: AC
  Administered 2015-12-31: 1000 mg via ORAL
  Filled 2015-12-31: qty 4

## 2015-12-31 MED ORDER — CEFTRIAXONE SODIUM 1 G IJ SOLR
1.0000 g | Freq: Once | INTRAMUSCULAR | Status: DC
Start: 2015-12-31 — End: 2015-12-31

## 2015-12-31 NOTE — ED Provider Notes (Signed)
I have personally seen and examined the patient.  I have discussed the plan of care with the resident.  I have reviewed the documentation on PMH/FH/Soc. History.  I have reviewed the documentation of the resident and agree.   EKG Interpretation  Date/Time:    Ventricular Rate:    PR Interval:    QRS Duration:   QT Interval:    QTC Calculation:   R Axis:     Text Interpretation:         30 yo M with left testicular pain off and on for the past week as well as right-sided flank pain. Flank pain started yesterday. Worse with movement palpation. Denies injury. Testicles worse with running jumping. On exam patient with tenderness about the entire right side. Worse with movement. Likely muscular skeletal. No noted penile discharge. Positive Prehn sign. Tenderness about the epididymis. No noted hernias. Suspect epididymitis. Will obtain a ultrasound.  US with varicocele, urology follow up.  UA with +LE, trace bacteria, more likely to be STD than uti, will treat. D/c home.   Melene Planan Chancie Lampert, DO 12/31/15 1208

## 2015-12-31 NOTE — ED Notes (Signed)
Pt is c/o right flank pain that started about 4 days ago  Pt states he had left testicle pain about a week ago  Pt denies any urinary sxs such as burning or frequency  Pt states the pain in his side gets worse with certain movements or coughing

## 2015-12-31 NOTE — ED Notes (Signed)
C/o left sided abd. Pain onset last week , also had left testicle pain , states still has pain in testicle off and on. C/o nausea no vomiting

## 2015-12-31 NOTE — ED Notes (Signed)
Called to recheck vital signs without response from lobby

## 2015-12-31 NOTE — Discharge Instructions (Signed)
Your pain in the side seems to be related to muscle pain from heavy lifting.  Do light duty at work for the next week or until you are feeling better.  Take Aleve twice daily for at least 1 wk or until your pain is gone.  You were treated for possible STD.  Please inform partner and no sex until they are tested/treated.  Call urology to make follow-up appointment.  Varicocele A varicocele is a swelling of veins in the scrotum. The scrotum is the sac that contains the testicles. Varicoceles can occur on either side of the scrotum, but they are more common on the left side. They occur most often in teenage boys and young men. In most cases, varicoceles are not a serious problem. They are usually small and painless and do not require treatment. Tests may be done to confirm the diagnosis. Treatment may be needed if:  A varicocele is large, causes a lot of pain, or causes pain when exercising.  Varicoceles are found on both sides of the scrotum.  The testicle on the opposite side is absent or not normal.  A varicocele causes a decrease in the size of the testicle in a growing adolescent.  The person has fertility problems. CAUSES This condition is the result of valves in the veins not working properly. Valves in the veins help to return blood from the scrotum and testicles to the heart. If these valves do not work well, blood flows backward and backs up into the veins, which causes the veins to swell. This is similar to what happens when varicose veins form in the leg. SYMPTOMS Most varicoceles do not cause any symptoms. If symptoms do occur, they may include:  Swelling on one side of the scrotum. The swelling may be more obvious when you are standing up.  A lumpy feeling in the scrotum.  A heavy feeling on one side of the scrotum.  A dull ache in the scrotum, especially after exercise or prolonged standing or sitting.  Slower growth or reduced size of the testicle on the side of the  varicocele (in young males).  Problems with fertility. These can occur if the testicle does not grow normally. DIAGNOSIS This condition may be diagnosed with a physical exam. You may also have an imaging test, called an ultrasound, to confirm the diagnosis and to help rule out other causes of the swelling. TREATMENT Treatment is usually not needed for this condition. If you have any pain, your health care provider may prescribe or recommend medicine to help relieve it. You may need regular exams so your health care provider can monitor the varicocele to ensure that it does not cause problems. When further treatment is needed, it may involve one of these options:  Varicocelectomy. This is a surgery in which the swollen veins are tied off so that the flow of blood goes to other veins instead.  Embolization. In this procedure, a small tube (catheter) is used to place metal coils or other blocking items in the veins. This cuts off the blood flow to the swollen veins. HOME CARE INSTRUCTIONS  Take medicines only as directed by your health care provider.  Wear supportive underwear.  Use an athletic supporter for sports.  Keep all follow-up visits as directed by your health care provider. This is important. SEEK MEDICAL CARE IF:  Your pain is increasing.  You have redness in the affected area.  You have swelling that does not decrease when you are lying down.  One of your testicles is smaller than the other.  Your testicle becomes enlarged, swollen, or painful.   This information is not intended to replace advice given to you by your health care provider. Make sure you discuss any questions you have with your health care provider.   Document Released: 03/13/2001 Document Revised: 04/21/2015 Document Reviewed: 11/12/2014 [employer] Interactive Patient Education [employer]2016 [employer]

## 2015-12-31 NOTE — ED Provider Notes (Signed)
CSN: [id number]     Arrival date & time 12/31/15  0734 History   First MD Initiated Contact with Patient 12/31/15 0935     Chief Complaint  Patient presents with  . Abdominal Pain  . Testicle Pain     (Consider location/radiation/quality/duration/timing/severity/associated sxs/prior Treatment) HPI Comments: 30 y.o. M with no PMH presenting for L testicular pain and R flank pain.  Testicular pain is intermittent for ~1 wk.  Pain is sharp and radiates to L groin when patient is walking or running and playing basketball.  He ahs tried NSAIDs and aspirin without any relief.  He does not know any alleviating factors.  He has never had testicular pain previously.  He is sexually active with one male partner and only occasionally uses condoms.  Does occasionally have nausea and flushing associated with testicular pain.  His R flank pain started yesterday.  It is constant with intermittent worsening.  He reports lifting heavy (>100 lbs) boxes regularly over his R shoulder at work.  He has been coughing related to cold weather and this worsens flank pain.  He denies previous pain like this.  Denies any fevers, dysuria, hematuria, urinary frequency, penile discharge, abd pain.  Patient is a 30 y.o. male presenting with testicular pain and flank pain. The history is provided by the patient.  Testicle Pain This is a new problem. The current episode started in the past 7 days. The problem occurs intermittently. The problem has been unchanged. Associated symptoms include coughing and nausea. Pertinent negatives include no abdominal pain, chills, fever, urinary symptoms or vomiting. The symptoms are aggravated by walking and exertion. He has tried NSAIDs for the symptoms. The treatment provided no relief.  Flank Pain This is a new problem. The current episode started yesterday. The problem occurs constantly. The problem has been unchanged. Associated symptoms include coughing and nausea. Pertinent negatives  include no abdominal pain, chills, fever, urinary symptoms or vomiting. The symptoms are aggravated by coughing. He has tried NSAIDs for the symptoms. The treatment provided no relief.    Past Medical History  Diagnosis Date  . Asthma    No past surgical history on file. Family History  Problem Relation Age of Onset  . Cancer Other   . Asthma Other    Social History  Substance Use Topics  . Smoking status: Current Some Day Smoker -- 1.00 packs/day    Types: Cigarettes  . Smokeless tobacco: None  . Alcohol Use: Yes     Comment: social    Review of Systems  Constitutional: Negative for fever and chills.  Respiratory: Positive for cough.   Gastrointestinal: Positive for nausea. Negative for vomiting and abdominal pain.  Genitourinary: Positive for flank pain and testicular pain.  All other systems reviewed and are negative.     Allergies  Penicillins  Home Medications   Prior to Admission medications   Medication Sig Start Date End Date Taking? Authorizing Provider  Aspirin-Caffeine (BC FAST PAIN RELIEF PO) Take 1 packet by mouth daily as needed (pain).   Yes Historical Provider, MD  ibuprofen (ADVIL,MOTRIN) 200 MG tablet Take 200-600 mg by mouth every 6 (six) hours as needed for moderate pain.   Yes Historical Provider, MD   BP 137/87 mmHg  Pulse 56  Temp(Src) 97.9 F (36.6 C) (Oral)  Resp 20  Ht 6\' 3"  (1.905 m)  Wt 108.863 kg  BMI 30.00 kg/m2  SpO2 99% Physical Exam  Constitutional: He is oriented to person, place, and time. He appears  well-developed and well-nourished. No distress.  HENT:  Head: Normocephalic and atraumatic.  Right Ear: External ear normal.  Left Ear: External ear normal.  Mouth/Throat: No oropharyngeal exudate.  Eyes: Conjunctivae and EOM are normal. Pupils are equal, round, and reactive to light. No scleral icterus.  Neck: Neck supple.  Cardiovascular: Normal rate, regular rhythm, normal heart sounds and intact distal pulses.   No murmur  heard. Pulmonary/Chest: Effort normal and breath sounds normal. No respiratory distress. He has no wheezes. He has no rales. He exhibits no tenderness.  Abdominal: Soft. Bowel sounds are normal. He exhibits no distension. There is no tenderness. There is no rebound and no guarding.  Genitourinary: Penis normal.  No masses in scrotum. Testicles comparable in size. No TTP over either testicle.  No TTP over L epididymis. No inguinal hernias. No rashes over scrotum.  Musculoskeletal: He exhibits no edema.  R CVA tenderness and R flank TTP over lateral rib cage and musculature diffusely  Lymphadenopathy:    He has no cervical adenopathy.  Neurological: He is alert and oriented to person, place, and time. No cranial nerve deficit.  Skin: Skin is warm and dry. No rash noted.  Psychiatric: He has a normal mood and affect. His behavior is normal.    ED Course  Procedures (including critical care time) Labs Review Labs Reviewed  URINALYSIS, ROUTINE W REFLEX MICROSCOPIC (NOT AT [facility]) - Abnormal; Notable for the following:    Leukocytes, UA TRACE (*)    All other components within normal limits  URINE MICROSCOPIC-ADD ON - Abnormal; Notable for the following:    Bacteria, UA RARE (*)    All other components within normal limits  GC/CHLAMYDIA PROBE AMP ([place]) NOT AT [facility]    Imaging Review US Scrotum  12/31/2015  CLINICAL DATA:  Left scrotal pain for 1 week. EXAM: SCROTAL ULTRASOUND DOPPLER ULTRASOUND OF THE TESTICLES TECHNIQUE: Complete ultrasound examination of the testicles, epididymis, and other scrotal structures was performed. Color and spectral Doppler ultrasound were also utilized to evaluate blood flow to the testicles. COMPARISON:  None. FINDINGS: Right testicle Measurements: 5.4 x 2.4 x 3.6 cm, within normal limits. No mass or microlithiasis visualized. Left testicle Measurements: 4.8 x 2.3 x 3.6 cm, within normal limits. No mass or microlithiasis visualized. Right epididymis:  Normal  in size and appearance. Left epididymis:  Normal in size and appearance. Hydrocele:  None visualized. Varicocele: A left-sided varicocele is present. Veins dilate from 1.8 mm up to 2.3 mm with Valsalva maneuvers no mass lesion is present. Pulsed Doppler interrogation of both testes demonstrates normal low resistance arterial and venous waveforms bilaterally. IMPRESSION: 1. Left-sided varicocele. 2. Normal appearance of the testicles bilaterally. 3. Arterial and venous waveform analysis is normal bilaterally without evidence for torsion. Electronically Signed   By: Marin Roberts M.D.   On: 12/31/2015 11:23   [place] Art/ven Flow Abd Pelv Doppler  12/31/2015  CLINICAL DATA:  Left scrotal pain for 1 week. EXAM: SCROTAL ULTRASOUND DOPPLER ULTRASOUND OF THE TESTICLES TECHNIQUE: Complete ultrasound examination of the testicles, epididymis, and other scrotal structures was performed. Color and spectral Doppler ultrasound were also utilized to evaluate blood flow to the testicles. COMPARISON:  None. FINDINGS: Right testicle Measurements: 5.4 x 2.4 x 3.6 cm, within normal limits. No mass or microlithiasis visualized. Left testicle Measurements: 4.8 x 2.3 x 3.6 cm, within normal limits. No mass or microlithiasis visualized. Right epididymis:  Normal in size and appearance. Left epididymis:  Normal in size and appearance. Hydrocele:  None visualized. Varicocele: A left-sided varicocele is present. Veins dilate from 1.8 mm up to 2.3 mm with Valsalva maneuvers no mass lesion is present. Pulsed Doppler interrogation of both testes demonstrates normal low resistance arterial and venous waveforms bilaterally. IMPRESSION: 1. Left-sided varicocele. 2. Normal appearance of the testicles bilaterally. 3. Arterial and venous waveform analysis is normal bilaterally without evidence for torsion. Electronically Signed   By: Marin Roberts M.D.   On: 12/31/2015 11:23   I have personally reviewed and evaluated these images and  lab results as part of my medical decision-making.   EKG Interpretation None      MDM   Final diagnoses:  Testicular pain, left  Testicle pain  Varicocele  Right flank pain    29 y.o. M presenting with L testicular pain and R flank pain.  Physical exam and symptoms of R flank pain consistent with MSK injury, especially in setting of heavy lifting.  No hematuria on UA to suggest nephrolithiasis.  Will treat with NSAID course at home.  Testicular pain most consistent with epididymitis with tenderness along posterior pole on re-exam.  Urine sent for GC/CT and patient empirically treated with Ceftriaxone and Azithromycin in ED.  No overt UTI on UA, but rare bacteria that could also represent STI.  Ultrasound of scrotum shows no torsion, normal epididymis and L varicocele.  Advised patient to follow-up with urology as outpatient (given contact information on discharge).  Also advised patient to notify any sexual partners of possible STI and no sexual activity until they are treated as well.    Patient safe for discharge home and patient agrees with plan.    Erasmo Downer, MD 12/31/15 1202  Melene Plan, DO 12/31/15 1207

## 2017-10-01 ENCOUNTER — Emergency Department (HOSPITAL_COMMUNITY): Payer: Self-pay

## 2017-10-01 ENCOUNTER — Encounter (HOSPITAL_COMMUNITY): Payer: Self-pay | Admitting: Emergency Medicine

## 2017-10-01 ENCOUNTER — Emergency Department (HOSPITAL_COMMUNITY)
Admission: EM | Admit: 2017-10-01 | Discharge: 2017-10-01 | Disposition: A | Discharge status: Home / Self Care | Payer: Self-pay | Attending: Emergency Medicine | Admitting: Emergency Medicine

## 2017-10-01 DIAGNOSIS — R52 Pain, unspecified: Secondary | ICD-10-CM

## 2017-10-01 DIAGNOSIS — F17210 Nicotine dependence, cigarettes, uncomplicated: Secondary | ICD-10-CM | POA: Insufficient documentation

## 2017-10-01 DIAGNOSIS — M25462 Effusion, left knee: Secondary | ICD-10-CM

## 2017-10-01 IMAGING — CR DG KNEE COMPLETE 4+V*L*
4 series · 4 of 4 positions shown · non-contrast
Comparison: None.

CLINICAL DATA: Knee swelling this morning. Symptoms for 1 month. No
known injury.

EXAM:
LEFT KNEE - COMPLETE 4+ VIEW

[t knee ap left]
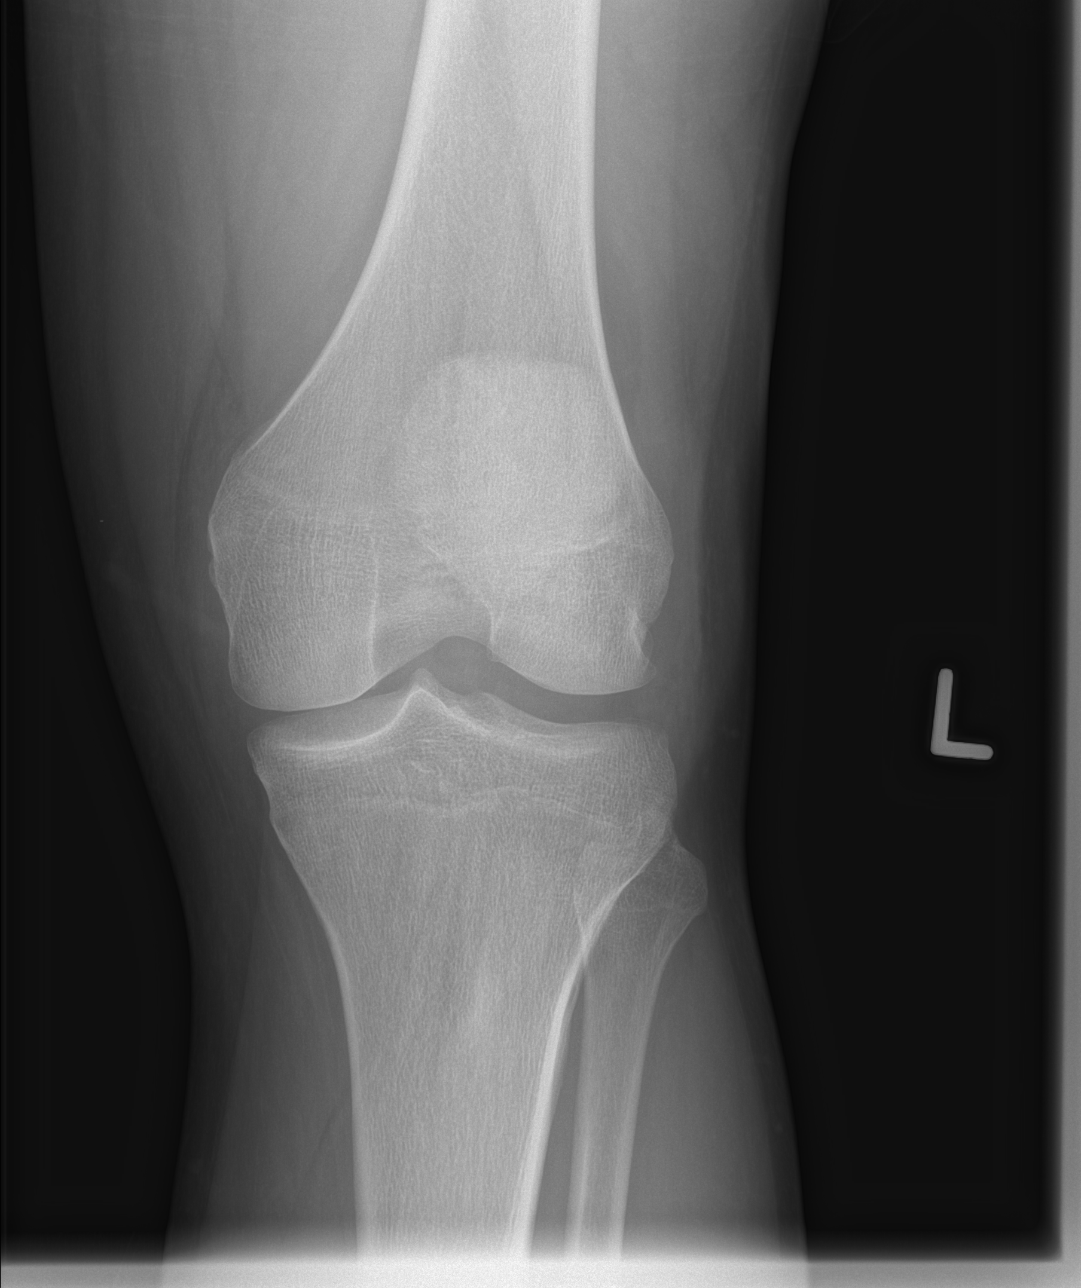

[t knee obl left (1 of 2)]
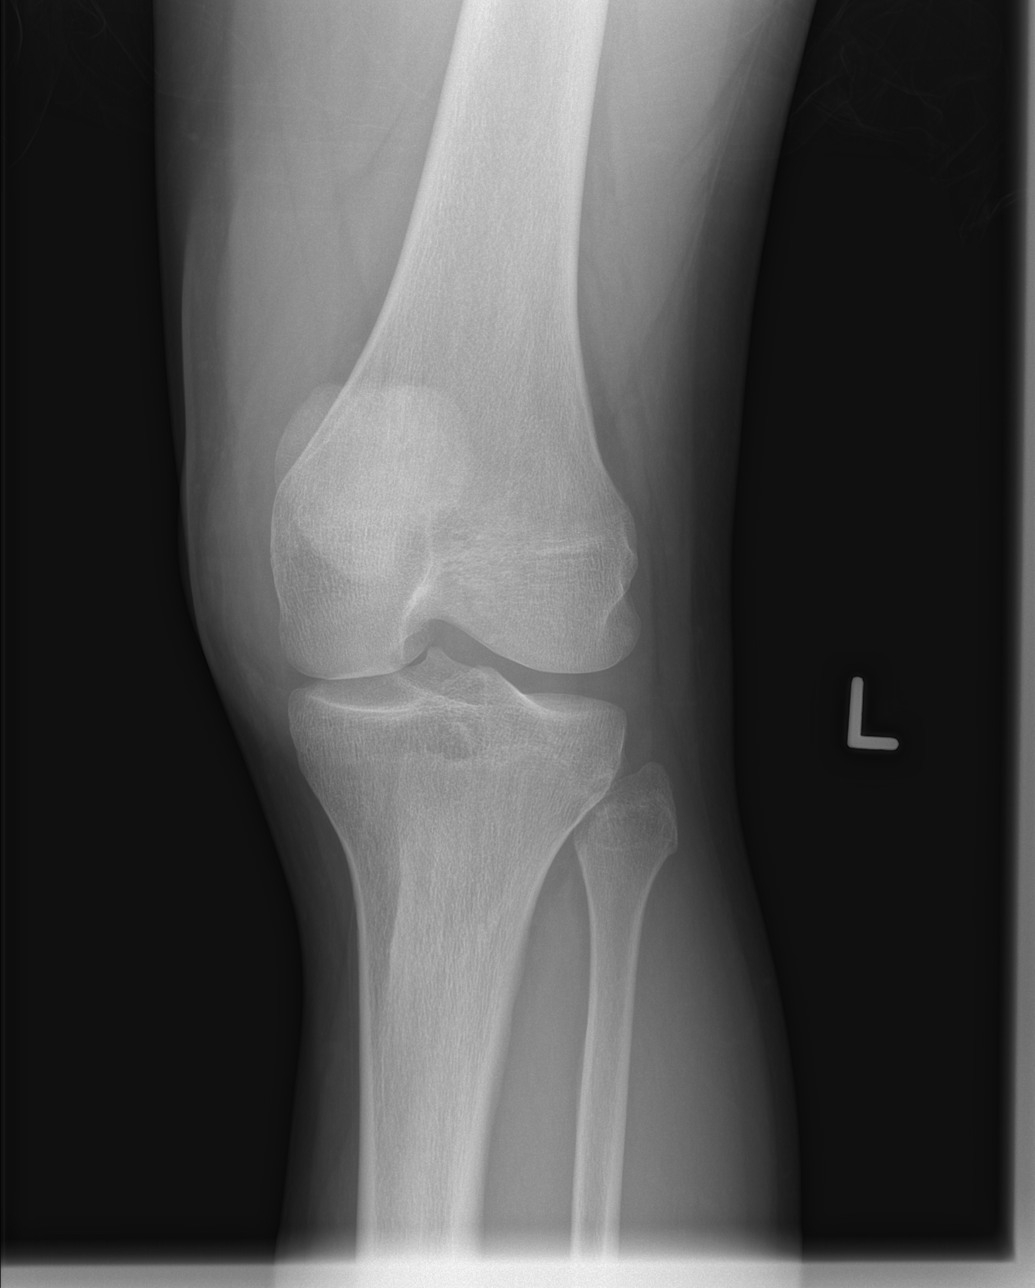

[t knee obl left (2 of 2)]
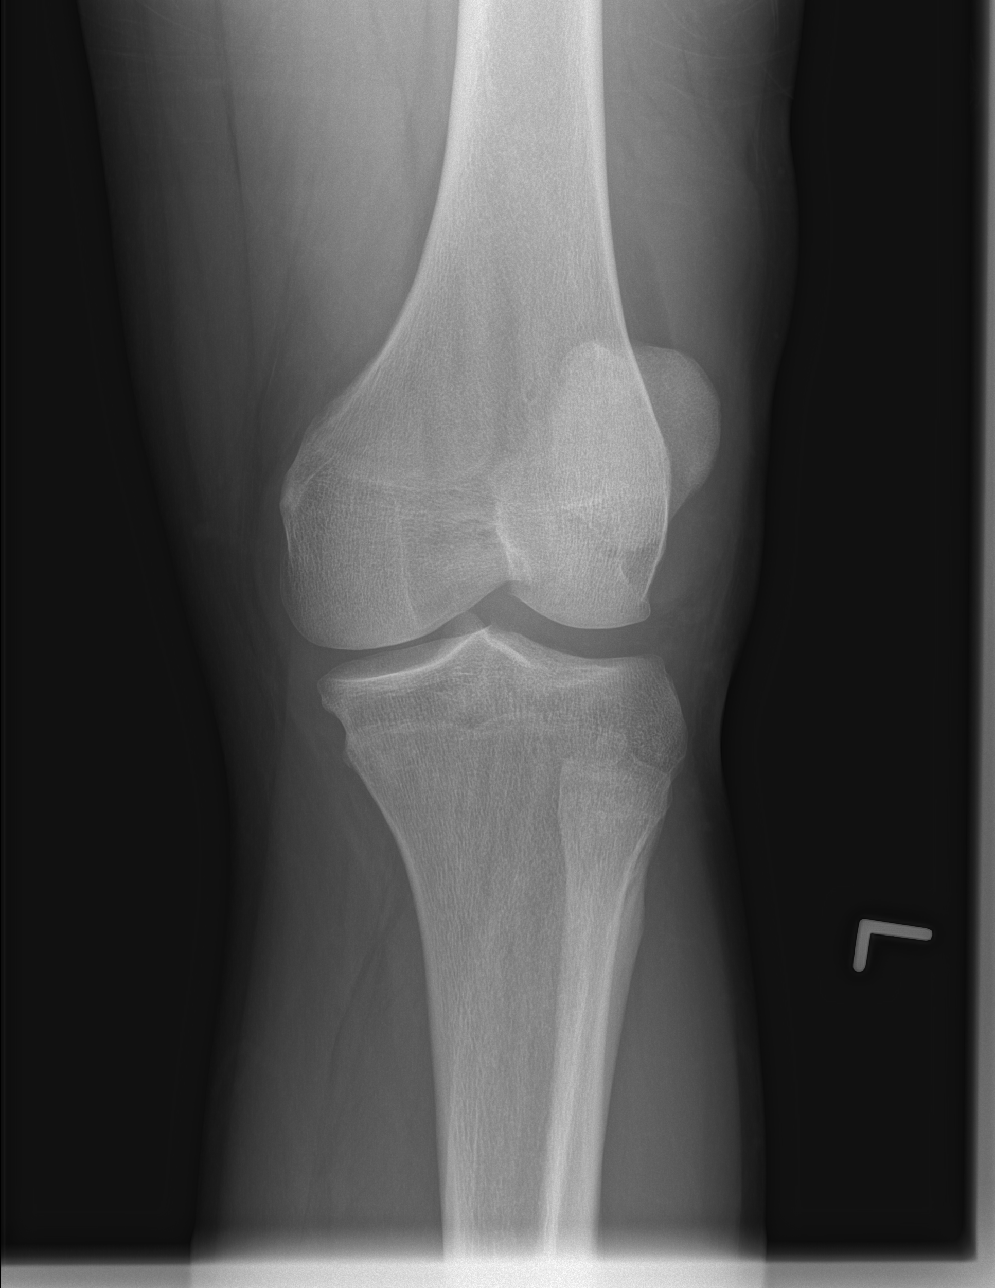

[t knee lat left]
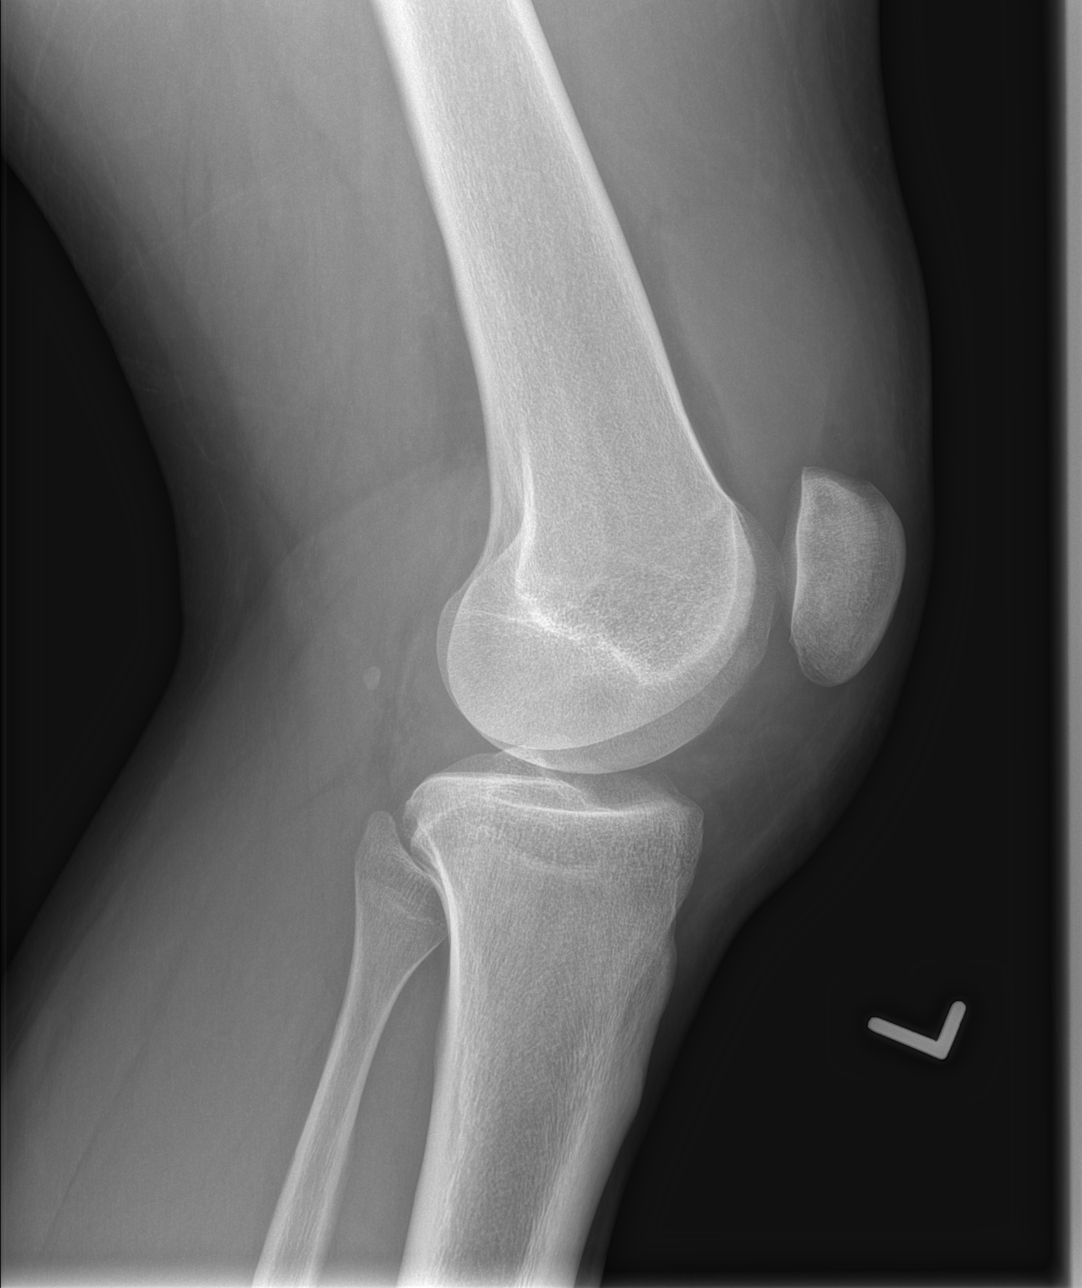

[4 of 4 positions shown; findings below may reference images not displayed]

FINDINGS: Large joint effusion. No fracture deformity or subluxation. No
degenerative changes or evidence of bone lesion.
IMPRESSION: Large joint effusion without osseous abnormality.

## 2017-10-01 MED ORDER — IBUPROFEN 200 MG PO TABS
400.0000 mg | ORAL_TABLET | Freq: Once | ORAL | Status: AC | PRN
  Administered 2017-10-01: 400 mg via ORAL
  Filled 2017-10-01: qty 2

## 2017-10-01 MED ORDER — DICLOFENAC SODIUM 50 MG PO TBEC: 50.0000 mg | DELAYED_RELEASE_TABLET | Freq: Two times a day (BID) | ORAL | 0 refills | Status: DC

## 2017-10-01 MED ORDER — OXYCODONE-ACETAMINOPHEN 5-325 MG PO TABS
1.0000 | ORAL_TABLET | Freq: Once | ORAL | Status: AC
  Administered 2017-10-01: 1 via ORAL
  Filled 2017-10-01: qty 1

## 2017-10-01 NOTE — ED Triage Notes (Signed)
Pt brought in by [facility] with c/o left knee pain  Pt states he was playing basketball around 3pm yesterday and jumped up for the ball and when he came down her heard a pop   Pt states he has had pain in it since  Pt states he took tylenol last night without relief

## 2017-10-01 NOTE — ED Notes (Signed)
Opened chart to write work note 

## 2017-10-01 NOTE — ED Provider Notes (Signed)
[facility]-EMERGENCY DEPT Provider Note   CSN: [id number] Arrival date & time: 10/01/17  4098     History   Chief Complaint Chief Complaint  Patient presents with  . Knee Pain    HPI Kurt Glass is a 31 y.o. male who presents to the ED with knee pain. Patient reports that he was playing basketball yesterday afternoon and jumped up for a ball and when he came down heard a pop. C/o pain and swelling since the injury that has gotten worse. No relief with tylenol.   HPI  Past Medical History:  Diagnosis Date  . Asthma     There are no active problems to display for this patient.   History reviewed. No pertinent surgical history.     Home Medications    Prior to Admission medications   Medication Sig Start Date End Date Taking? Authorizing Provider  diclofenac (VOLTAREN) 50 MG EC tablet Take 1 tablet (50 mg total) by mouth 2 (two) times daily. 10/01/17   Janne Napoleon, NP    Family History Family History  Problem Relation Age of Onset  . Cancer Other   . Asthma Other     Social History Social History  Substance Use Topics  . Smoking status: Current Some Day Smoker    Packs/day: 1.00    Types: Cigarettes  . Smokeless tobacco: Never Used  . Alcohol use Yes     Comment: social     Allergies   Penicillins   Review of Systems Review of Systems  Constitutional: Negative for diaphoresis.  HENT: Negative.   Gastrointestinal: Negative for nausea and vomiting.  Musculoskeletal: Positive for arthralgias.       Left knee pain and swelling  Skin: Negative for wound.     Physical Exam Updated Vital Signs BP (!) 163/99 (BP Location: Right Arm)   Pulse 76   Temp 98.2 F (36.8 C) (Oral)   Resp 16   SpO2 99%   Physical Exam  Constitutional: He appears well-developed and well-nourished. No distress.  Eyes: EOM are normal.  Neck: Neck supple.  Cardiovascular: Normal rate.   Pulmonary/Chest: Effort normal.  Abdominal: There is no tenderness.    Musculoskeletal:       Left knee: He exhibits decreased range of motion and effusion. He exhibits no erythema. Tenderness found.  Large joint effusion to the left keen difficulty with flexion of the knee due to swelling. Pedal pulse 2+. No concern for compartment syndrome at this time.   Neurological: He is alert.  Skin: Skin is warm and dry.  Psychiatric: He has a normal mood and affect.  Nursing note and vitals reviewed.    ED Treatments / Results  Labs (all labs ordered are listed, but only abnormal results are displayed) Labs Reviewed - No data to display   Radiology Dg Knee Complete 4 Views Left  Result Date: 10/01/2017 CLINICAL DATA:  Knee swelling this morning. Symptoms for 1 month. No known injury. EXAM: LEFT KNEE - COMPLETE 4+ VIEW COMPARISON:  None. FINDINGS: Large joint effusion. No fracture deformity or subluxation. No degenerative changes or evidence of bone lesion. IMPRESSION: Large joint effusion without osseous abnormality. Electronically Signed   By: Marnee Spring M.D.   On: 10/01/2017 06:59   I discussed this case with Dr. Juleen China. Knee brace, crutches, pain management and f/u with ortho. Procedures Procedures (including critical care time)  Medications Ordered in ED Medications  ibuprofen (ADVIL,MOTRIN) tablet 400 mg (400 mg Oral Given 10/01/17 1103)  oxyCODONE-acetaminophen (  PERCOCET/ROXICET) 5-325 MG per tablet 1 tablet (1 tablet Oral Given 10/01/17 1136)     Initial Impression / Assessment and Plan / ED Course  I have reviewed the triage vital signs and the nursing notes. 31 y.o. male with knee pain and swelling s/p injury stable for d/c without signs of compartment syndrome. Discussed return precautions with the patient and need for f/u. Patient agrees with plan. Knee brace, crutches, ice and elevation, pain management and f/u with ortho.  Final Clinical Impressions(s) / ED Diagnoses   Final diagnoses:  Effusion of left knee    New  Prescriptions Discharge Medication List as of 10/01/2017 11:33 AM    START taking these medications   Details  diclofenac (VOLTAREN) 50 MG EC tablet Take 1 tablet (50 mg total) by mouth 2 (two) times daily., Starting Sun 10/01/2017, Print         [place], [place], NP 10/01/17 Criss Rosales    Raeford Razor, MD 10/02/17 1246

## 2017-10-01 NOTE — Discharge Instructions (Signed)
If your symptoms persist follow up with Dr. Aundria Rud. If your symptoms worsen, if you develop numbness, foot feeling cold, increased pain despite treatment or other problems return here.

## 2017-10-04 ENCOUNTER — Emergency Department (HOSPITAL_COMMUNITY): Payer: Self-pay

## 2017-10-04 ENCOUNTER — Emergency Department (HOSPITAL_COMMUNITY)
Admission: EM | Admit: 2017-10-04 | Discharge: 2017-10-04 | Disposition: A | Discharge status: Home / Self Care | Payer: Self-pay | Attending: Emergency Medicine | Admitting: Emergency Medicine

## 2017-10-04 ENCOUNTER — Encounter (HOSPITAL_COMMUNITY): Payer: Self-pay

## 2017-10-04 DIAGNOSIS — M25461 Effusion, right knee: Secondary | ICD-10-CM

## 2017-10-04 DIAGNOSIS — M25462 Effusion, left knee: Secondary | ICD-10-CM | POA: Insufficient documentation

## 2017-10-04 DIAGNOSIS — J45909 Unspecified asthma, uncomplicated: Secondary | ICD-10-CM | POA: Insufficient documentation

## 2017-10-04 DIAGNOSIS — Z87891 Personal history of nicotine dependence: Secondary | ICD-10-CM | POA: Insufficient documentation

## 2017-10-04 IMAGING — CR DG KNEE COMPLETE 4+V*L*
4 series · 4 of 4 positions shown · non-contrast
Comparison: [DATE]

CLINICAL DATA: Pain and swelling

EXAM:
LEFT KNEE - COMPLETE 4+ VIEW

[t knee ap left]
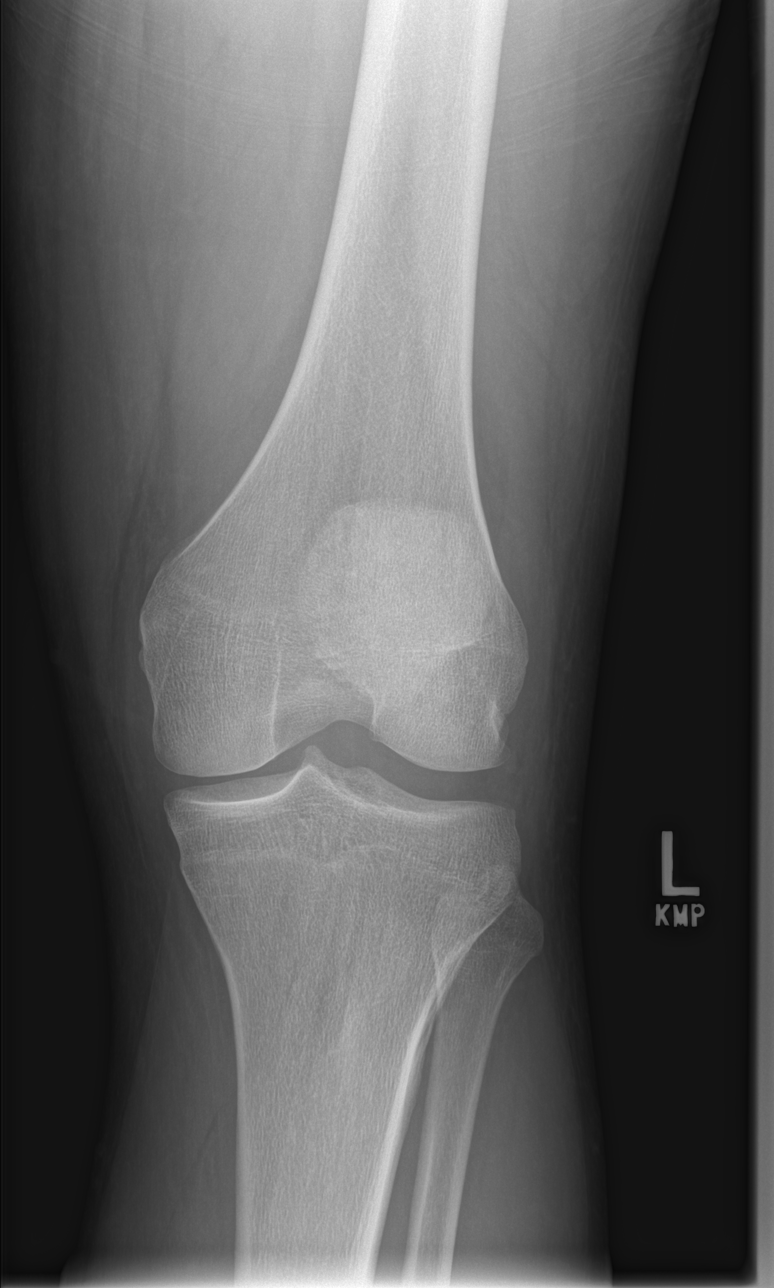

[t knee obl left (1 of 2)]
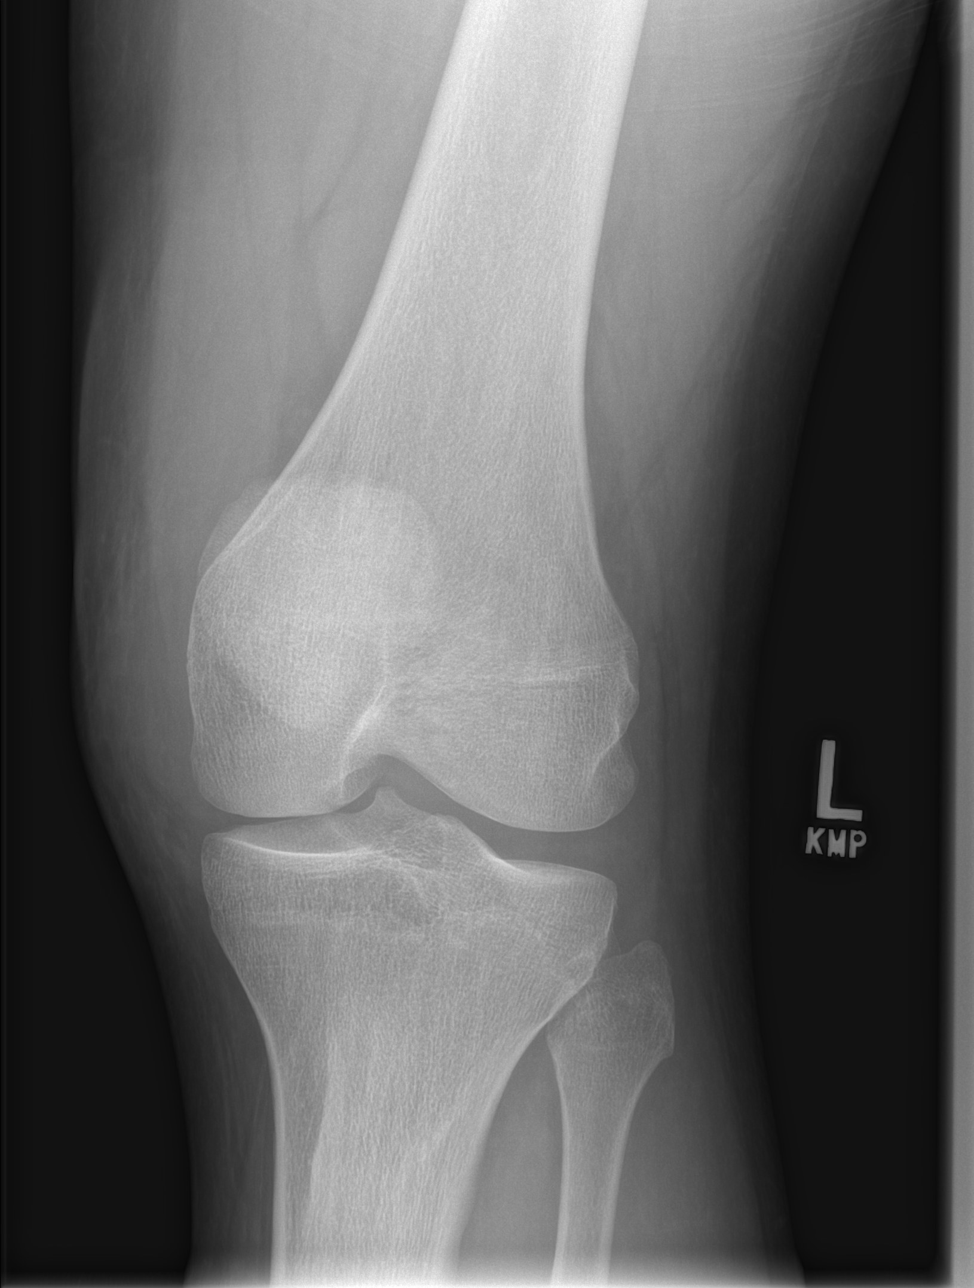

[t knee obl left (2 of 2)]
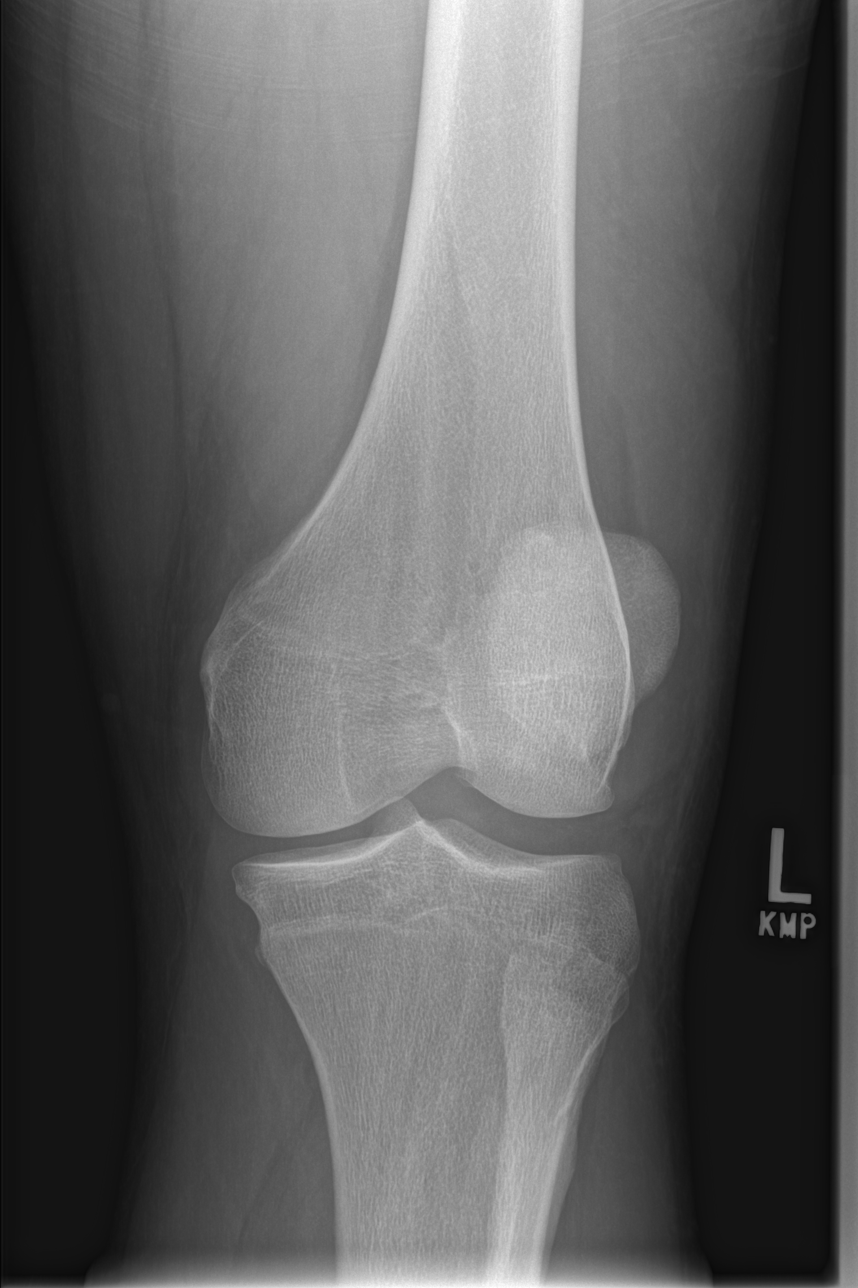

[t knee lat left]
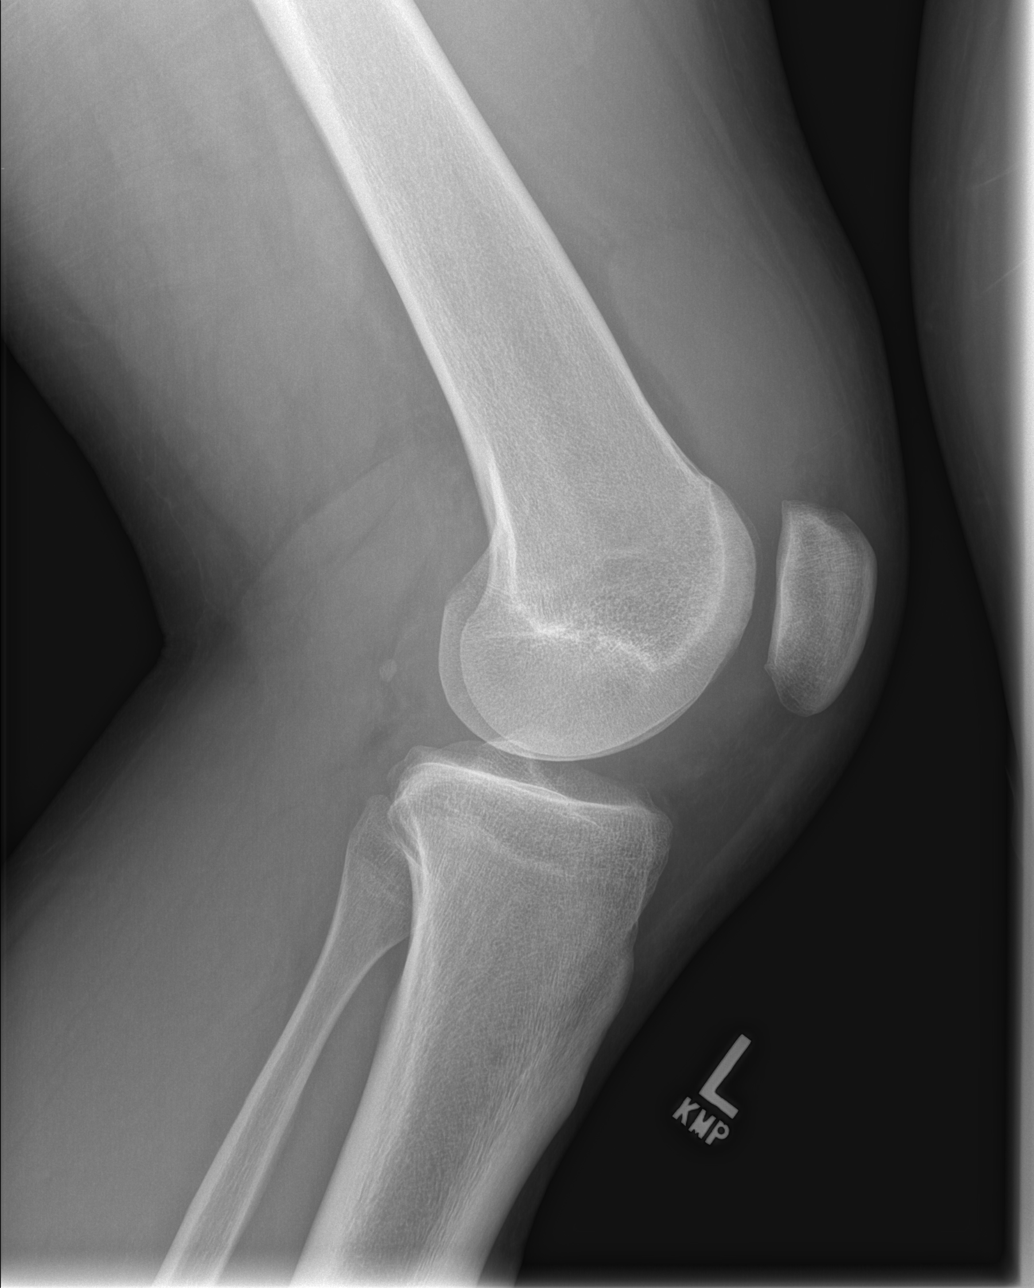

[4 of 4 positions shown; findings below may reference images not displayed]

FINDINGS: Frontal, lateral common bile oblique views were obtained. Large
joint effusion remains. No fracture or dislocation. No appreciable
joint space narrowing or erosion.
IMPRESSION: Large joint effusion remains. No fracture or dislocation. No evident
arthropathy.

## 2017-10-04 MED ORDER — HYDROCODONE-ACETAMINOPHEN 5-325 MG PO TABS: ORAL_TABLET | ORAL | 0 refills | Status: DC

## 2017-10-04 NOTE — ED Notes (Signed)
Bed: WTR8 Expected date:  Expected time:  Means of arrival:  Comments: 

## 2017-10-04 NOTE — ED Provider Notes (Signed)
[facility]-EMERGENCY DEPT Provider Note   CSN: [id number] Arrival date & time: 10/04/17  1022     History   Chief Complaint Chief Complaint  Patient presents with  . Knee Pain    HPI   Blood pressure (!) 142/86, pulse 72, temperature 98.3 F (36.8 C), temperature source Oral, resp. rate 16, height 6\' 3"  (1.905 m), weight 117.9 kg (260 lb), SpO2 94 %.  Kurt Glass is a 31 y.o. male complaining of persistent pain and swelling to left knee after hearing a pop while he was playing basketball several days ago. This patient was evaluated at the time, x-ray negative, patient given crutches and Voltaren gel with little relief. He states that he had an other episode about one month ago which was similar. It healed on its own, he had no pain or knee instability after the first episode. He states that the pain and swelling is severe and he requests a note because he cannot go back to work. Pain is exacerbated by movement, flexion, weightbearing.  Past Medical History:  Diagnosis Date  . Asthma     There are no active problems to display for this patient.   History reviewed. No pertinent surgical history.     Home Medications    Prior to Admission medications   Medication Sig Start Date End Date Taking? Authorizing Provider  HYDROcodone-acetaminophen (NORCO/VICODIN) 5-325 MG tablet Take 1-2 tablets by mouth every 6 hours as needed for pain. 10/04/17   Aashvi Rezabek, Joni Reining, PA-C    Family History Family History  Problem Relation Age of Onset  . Cancer Other   . Asthma Other     Social History Social History  Substance Use Topics  . Smoking status: Former Smoker    Packs/day: 1.00    Types: Cigarettes  . Smokeless tobacco: Never Used  . Alcohol use Yes     Comment: social     Allergies   Penicillins   Review of Systems Review of Systems  A complete review of systems was obtained and all systems are negative except as noted in the HPI  and PMH.    Physical Exam Updated Vital Signs BP (!) 142/86 (BP Location: Left Arm)   Pulse 72   Temp 98.3 F (36.8 C) (Oral)   Resp 16   Ht 6\' 3"  (1.905 m)   Wt 117.9 kg (260 lb)   SpO2 94%   BMI 32.50 kg/m   Physical Exam  Constitutional: He is oriented to person, place, and time. He appears well-developed and well-nourished. No distress.  HENT:  Head: Normocephalic and atraumatic.  Mouth/Throat: Oropharynx is clear and moist.  Eyes: Pupils are equal, round, and reactive to light. Conjunctivae and EOM are normal.  Neck: Normal range of motion.  Cardiovascular: Normal rate, regular rhythm and intact distal pulses.   Pulmonary/Chest: Effort normal and breath sounds normal.  Abdominal: Soft. There is no tenderness.  Musculoskeletal: He exhibits edema.  Left knee with significant effusion, no significant warmth, no gross instability of the knee on anterior posterior drawer. Distally neurovascularly intact, slightly reduced range of motion secondary to pain.  Neurological: He is alert and oriented to person, place, and time.  Skin: He is not diaphoretic.  Psychiatric: He has a normal mood and affect.  Nursing note and vitals reviewed.    ED Treatments / Results  Labs (all labs ordered are listed, but only abnormal results are displayed) Labs Reviewed - No data to display  EKG  EKG  Interpretation None       Radiology Dg Knee Complete 4 Views Left  Result Date: 10/04/2017 CLINICAL DATA:  Pain and swelling EXAM: LEFT KNEE - COMPLETE 4+ VIEW COMPARISON:  October 01, 2017 FINDINGS: Frontal, lateral common bile oblique views were obtained. Large joint effusion remains. No fracture or dislocation. No appreciable joint space narrowing or erosion. IMPRESSION: Large joint effusion remains. No fracture or dislocation. No evident arthropathy. Electronically Signed   By: Bretta BangWilliam  Woodruff III M.D.   On: 10/04/2017 12:43    Procedures Procedures (including critical care  time)  Medications Ordered in ED Medications - No data to display   Initial Impression / Assessment and Plan / ED Course  I have reviewed the triage vital signs and the nursing notes.  Pertinent labs & imaging results that were available during my care of the patient were reviewed by me and considered in my medical decision making (see chart for details).     Vitals:   10/04/17 1104  BP: (!) 142/86  Pulse: 72  Resp: 16  Temp: 98.3 F (36.8 C)  TempSrc: Oral  SpO2: 94%  Weight: 117.9 kg (260 lb)  Height: 6\' 3"  (1.905 m)     Kurt DanasJoshua Matthew Glass is 31 y.o. male presenting with left knee effusion after playing basketball several days ago, he states he heard a loud pop when he came down on the knee. He had a similar experience approximately one month ago. I'm concerned for ligamentous injury. Patient will be placed in a knee immobilizer. Orthopedic referral given, work note for one week.  Evaluation does not show pathology that would require ongoing emergent intervention or inpatient treatment. Pt is hemodynamically stable and mentating appropriately. Discussed findings and plan with patient/guardian, who agrees with care plan. All questions answered. Return precautions discussed and outpatient follow up given.    Final Clinical Impressions(s) / ED Diagnoses   Final diagnoses:  Effusion of right knee    New Prescriptions New Prescriptions   HYDROCODONE-ACETAMINOPHEN (NORCO/VICODIN) 5-325 MG TABLET    Take 1-2 tablets by mouth every 6 hours as needed for pain.     Kaylyn Limisciotta, Shyane Fossum, PA-C 10/04/17 1310    Derwood KaplanNanavati, Ankit, MD 10/05/17 2223

## 2017-10-04 NOTE — Discharge Instructions (Signed)
For pain control please take ibuprofen (also known as Motrin or Advil) 800mg (this is normally 4 over the counter pills) 3 times a day  for 5 days. Take with food to minimize stomach irritation. ° °Take vicodin for breakthrough pain, do not drink alcohol, drive, care for children or do other critical tasks while taking vicodin. ° °Do not hesitate to return to the emergency room for any new, worsening or concerning symptoms. ° °Please obtain primary care using resource guide below. Let them know that you were seen in the emergency room and that they will need to obtain records for further outpatient management. ° ° ° °

## 2017-10-04 NOTE — ED Triage Notes (Signed)
Patient states he was playing basketball 4 days ago and injured his left knee. Patient was seen 3 days ago. Patient continues to c/o pain and swelling. Patient states he is unable to work and his work note has expired.

## 2019-02-12 ENCOUNTER — Emergency Department (HOSPITAL_COMMUNITY)
Admission: EM | Admit: 2019-02-12 | Discharge: 2019-02-12 | Disposition: A | Discharge status: Home / Self Care | Payer: Self-pay | Attending: Emergency Medicine | Admitting: Emergency Medicine

## 2019-02-12 ENCOUNTER — Encounter (HOSPITAL_COMMUNITY): Payer: Self-pay | Admitting: Emergency Medicine

## 2019-02-12 ENCOUNTER — Emergency Department (HOSPITAL_COMMUNITY): Payer: Self-pay

## 2019-02-12 DIAGNOSIS — Y33XXXA Other specified events, undetermined intent, initial encounter: Secondary | ICD-10-CM | POA: Insufficient documentation

## 2019-02-12 DIAGNOSIS — Y939 Activity, unspecified: Secondary | ICD-10-CM | POA: Insufficient documentation

## 2019-02-12 DIAGNOSIS — Y999 Unspecified external cause status: Secondary | ICD-10-CM | POA: Insufficient documentation

## 2019-02-12 DIAGNOSIS — H6692 Otitis media, unspecified, left ear: Secondary | ICD-10-CM

## 2019-02-12 DIAGNOSIS — M25462 Effusion, left knee: Secondary | ICD-10-CM

## 2019-02-12 DIAGNOSIS — Z880 Allergy status to penicillin: Secondary | ICD-10-CM | POA: Insufficient documentation

## 2019-02-12 DIAGNOSIS — Y929 Unspecified place or not applicable: Secondary | ICD-10-CM | POA: Insufficient documentation

## 2019-02-12 DIAGNOSIS — Z87891 Personal history of nicotine dependence: Secondary | ICD-10-CM | POA: Insufficient documentation

## 2019-02-12 DIAGNOSIS — J45909 Unspecified asthma, uncomplicated: Secondary | ICD-10-CM | POA: Insufficient documentation

## 2019-02-12 DIAGNOSIS — T161XXA Foreign body in right ear, initial encounter: Secondary | ICD-10-CM

## 2019-02-12 DIAGNOSIS — H6122 Impacted cerumen, left ear: Secondary | ICD-10-CM | POA: Insufficient documentation

## 2019-02-12 IMAGING — CR DG KNEE COMPLETE 4+V*L*
4 series · 4 of 4 positions shown · non-contrast
Comparison: Prior radiograph from [DATE]

CLINICAL DATA: Initial evaluation for acute left knee pain, recent
injury.

EXAM:
LEFT KNEE - COMPLETE 4+ VIEW

[t knee ap left]
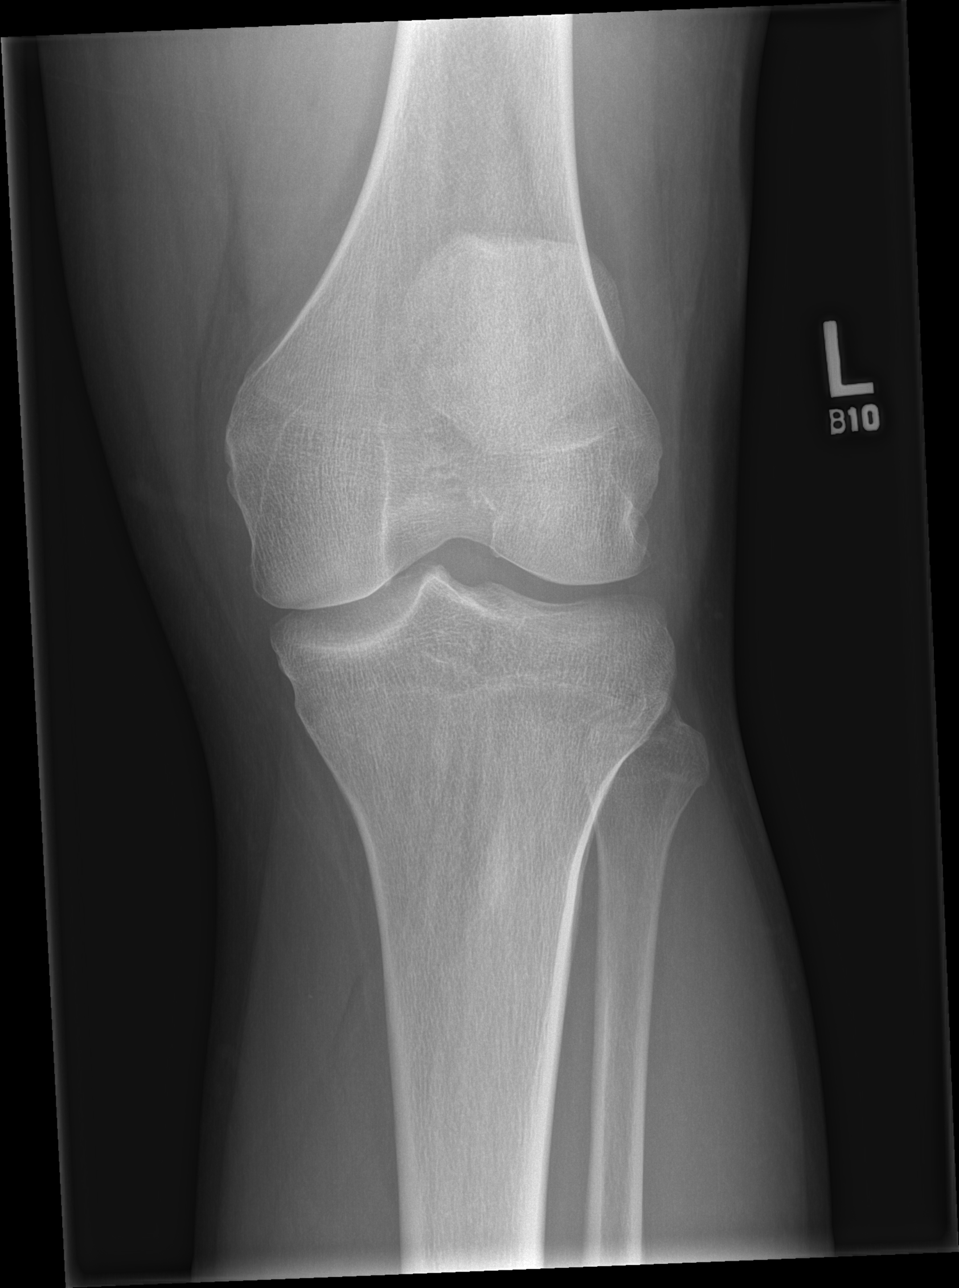

[t knee obl left (1 of 2)]
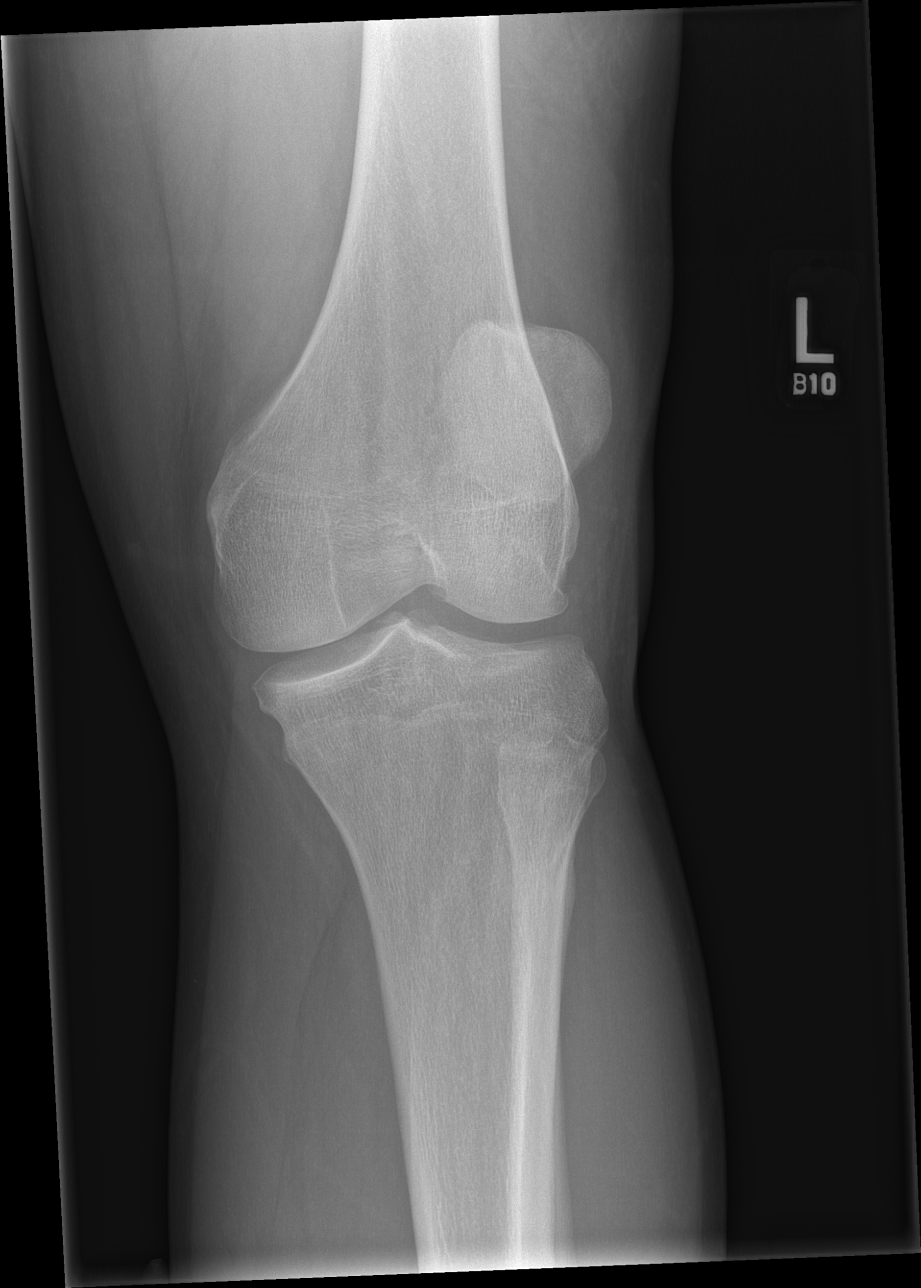

[t knee obl left (2 of 2)]
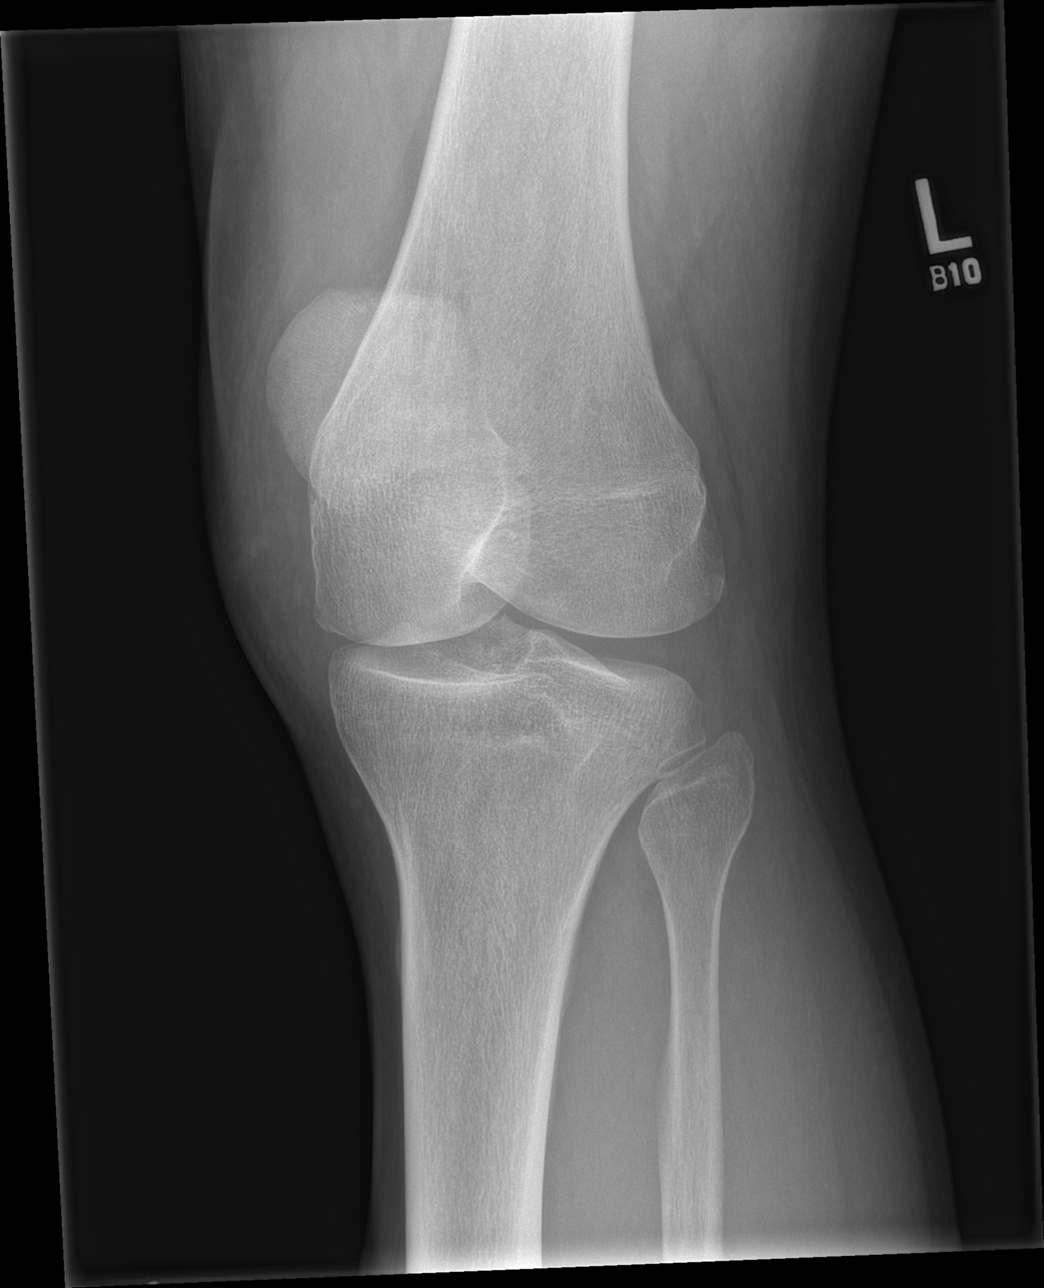

[t knee lat left]
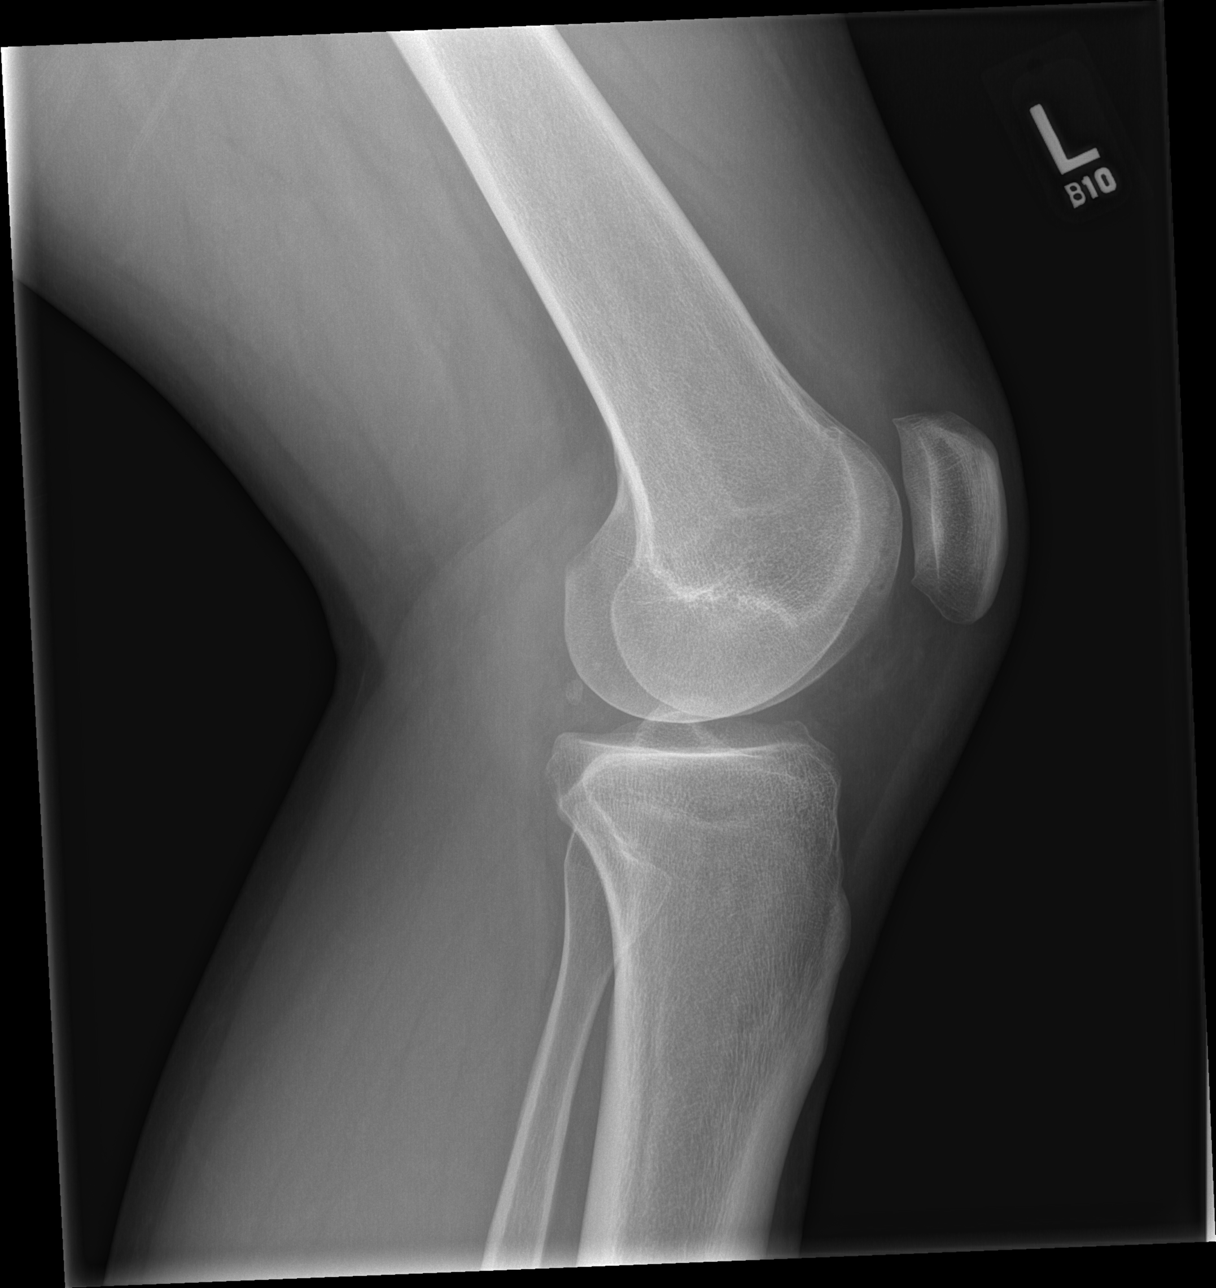

[4 of 4 positions shown; findings below may reference images not displayed]

FINDINGS: No acute fracture dislocation. Small moderate joint effusion within
the suprapatellar recess. Joint spaces maintained. Osseous
mineralization normal. No other soft tissue abnormality.
IMPRESSION: 1. No acute osseous abnormality.
2. Small to moderate joint effusion.

## 2019-02-12 MED ORDER — NAPROXEN 500 MG PO TABS: 500.0000 mg | ORAL_TABLET | Freq: Two times a day (BID) | ORAL | 0 refills | Status: DC

## 2019-02-12 MED ORDER — AZITHROMYCIN 250 MG PO TABS: 250.0000 mg | ORAL_TABLET | Freq: Every day | ORAL | 0 refills | Status: DC

## 2019-02-12 MED ORDER — PREDNISONE 10 MG PO TABS: 40.0000 mg | ORAL_TABLET | Freq: Every day | ORAL | 0 refills | Status: DC

## 2019-02-12 NOTE — ED Notes (Signed)
Ear irrigated with luke warm water. Small amount of wax from both ears irrigated.

## 2019-02-12 NOTE — ED Triage Notes (Signed)
Per pt, states left ear pain and difficulty hearing for a couple of days-states also having left knee pain, states he twisted it the other day

## 2019-02-12 NOTE — ED Provider Notes (Signed)
[facility]-EMERGENCY DEPT Provider Note   CSN: [id number] Arrival date & time: 02/12/19  1245    History   Chief Complaint Chief Complaint  Patient presents with  . Otalgia  . Knee Pain    HPI Kurt Glass is a 33 y.o. male who presents to the ED with left ear pain and difficulty hearing out of both ears for a couple of days. Patient also c/o left knee pain. Patient reports he twisted it a few days ago. Patient reports that a couple years ago he injured his left knee while playing basketball and now he has problems off and on.      HPI  Past Medical History:  Diagnosis Date  . Asthma     There are no active problems to display for this patient.   History reviewed. No pertinent surgical history.      Home Medications    Prior to Admission medications   Medication Sig Start Date End Date Taking? Authorizing Provider  azithromycin (ZITHROMAX) 250 MG tablet Take 1 tablet (250 mg total) by mouth daily. Take first 2 tablets together, then 1 every day until finished. 02/12/19   Janne Napoleon, NP  naproxen (NAPROSYN) 500 MG tablet Take 1 tablet (500 mg total) by mouth 2 (two) times daily. 02/12/19   Janne Napoleon, NP  predniSONE (DELTASONE) 10 MG tablet Take 4 tablets (40 mg total) by mouth daily with breakfast. 02/12/19   Janne Napoleon, NP    Family History Family History  Problem Relation Age of Onset  . Cancer Other   . Asthma Other     Social History Social History   Tobacco Use  . Smoking status: Former Smoker    Packs/day: 1.00    Types: Cigarettes  . Smokeless tobacco: Never Used  Substance Use Topics  . Alcohol use: Yes    Comment: social  . Drug use: No     Allergies   Penicillins   Review of Systems Review of Systems  HENT: Positive for ear pain.   Musculoskeletal: Positive for arthralgias and joint swelling.  Hematological: Positive for adenopathy.  All other systems reviewed and are negative.    Physical  Exam Updated Vital Signs BP (!) 159/99   Pulse 78   Temp 98.2 F (36.8 C) (Oral)   Resp 18   SpO2 99%   Physical Exam Vitals signs and nursing note reviewed.  Constitutional:      General: He is not in acute distress.    Appearance: He is well-developed.  HENT:     Head: Normocephalic.     Right Ear: Decreased hearing noted.     Left Ear: There is impacted cerumen.     Ears:     Comments: Foreign body right ear. After removed re examined and TM is normal. After cerumen removed from left ear re examined and there is infection.     Mouth/Throat:     Mouth: Mucous membranes are moist.  Eyes:     Extraocular Movements: Extraocular movements intact.     Conjunctiva/sclera: Conjunctivae normal.  Neck:     Musculoskeletal: Neck supple.  Cardiovascular:     Rate and Rhythm: Normal rate.  Pulmonary:     Effort: Pulmonary effort is normal.  Musculoskeletal:     Left knee: He exhibits swelling and effusion. He exhibits no deformity, no laceration, no erythema and normal alignment. Decreased range of motion: due to pain. Tenderness found.     Comments: Tenderness  and increased warmth on exam. Pedal pulse 2+.  Lymphadenopathy:     Cervical: Cervical adenopathy present.  Skin:    General: Skin is warm and dry.  Neurological:     Mental Status: He is alert and oriented to person, place, and time.  Psychiatric:        Mood and Affect: Mood normal.      ED Treatments / Results  Labs (all labs ordered are listed, but only abnormal results are displayed) Labs Reviewed - No data to display Radiology Dg Knee Complete 4 Views Left  Result Date: 02/12/2019 CLINICAL DATA:  Initial evaluation for acute left knee pain, recent injury. EXAM: LEFT KNEE - COMPLETE 4+ VIEW COMPARISON:  Prior radiograph from 10/04/2017 FINDINGS: No acute fracture dislocation. Small moderate joint effusion within the suprapatellar recess. Joint spaces maintained. Osseous mineralization normal. No other soft  tissue abnormality. IMPRESSION: 1. No acute osseous abnormality. 2. Small to moderate joint effusion. Electronically Signed   By: Rise Mu M.D.   On: 02/12/2019 16:38    Procedures: right ear .Foreign Body Removal Date/Time: 02/12/2019 4:56 PM Performed by: Janne Napoleon, NP Authorized by: Janne Napoleon, NP  Consent: Verbal consent obtained. Risks and benefits: risks, benefits and alternatives were discussed Consent given by: patient Patient understanding: patient states understanding of the procedure being performed Required items: required blood products, implants, devices, and special equipment available Patient identity confirmed: verbally with patient Body area: ear  Sedation: Patient sedated: no  Patient restrained: no Patient cooperative: yes Localization method: visualized Removal mechanism: irrigation Complexity: simple 1 objects recovered. Objects recovered: cotton swab Post-procedure assessment: foreign body removed Patient tolerance: Patient tolerated the procedure well with no immediate complications .Ear Cerumen Removal Date/Time: 02/12/2019 4:58 PM Performed by: Janne Napoleon, NP Authorized by: Janne Napoleon, NP   Consent:    Consent obtained:  Verbal   Consent given by:  Patient   Risks discussed:  Pain and incomplete removal   Alternatives discussed:  Alternative treatment Procedure details:    Location:  L ear   Procedure type: irrigation   Post-procedure details:    Patient tolerance of procedure:  Tolerated well, no immediate complications Comments:     Wax removed from left ear. TM visualized and is infected.    (including critical care time)  Medications Ordered in ED Medications - No data to display   Initial Impression / Assessment and Plan / ED Course  I have reviewed the triage vital signs and the nursing notes. Patient presents with otalgia and exam consistent with acute otitis media of the left and foreign body to the right.  Patient is allergic to Penicillin and will treat with Z-pak.  Advised patient to call PCP for follow up. Patient also with left knee pain and swelling with x-ray showing joint effusion. Knee sleeve, crutches, ice, elevation and f/u with ortho. Patient agrees with plan.     Final Clinical Impressions(s) / ED Diagnoses   Final diagnoses:  Acute otitis media, left  Knee effusion, left  Foreign body of right ear, initial encounter    ED Discharge Orders         Ordered    azithromycin (ZITHROMAX) 250 MG tablet  Daily     02/12/19 1655    naproxen (NAPROSYN) 500 MG tablet  2 times daily     02/12/19 1655    predniSONE (DELTASONE) 10 MG tablet  Daily with breakfast     02/12/19 1703  Kerrie Buffaloeese, Hope Tres ArroyosM, [place]NP 02/12/19 1707    Terrilee FilesButler, Michael C, MD 02/13/19 [id number]

## 2019-02-12 NOTE — Discharge Instructions (Signed)
Follow up with the orthopedic doctor for further evaluation of the knee effusion. Follow up with your doctor next week for recheck of the left ear infection. Return here as needed.

## 2019-11-08 ENCOUNTER — Encounter (HOSPITAL_COMMUNITY): Payer: Self-pay

## 2019-11-08 ENCOUNTER — Other Ambulatory Visit: Payer: Self-pay

## 2019-11-08 ENCOUNTER — Emergency Department (HOSPITAL_COMMUNITY)
Admission: EM | Admit: 2019-11-08 | Discharge: 2019-11-08 | Disposition: A | Discharge status: Home / Self Care | Payer: Self-pay | Attending: Emergency Medicine | Admitting: Emergency Medicine

## 2019-11-08 DIAGNOSIS — R300 Dysuria: Secondary | ICD-10-CM | POA: Insufficient documentation

## 2019-11-08 DIAGNOSIS — Z79899 Other long term (current) drug therapy: Secondary | ICD-10-CM | POA: Insufficient documentation

## 2019-11-08 DIAGNOSIS — H6122 Impacted cerumen, left ear: Secondary | ICD-10-CM | POA: Insufficient documentation

## 2019-11-08 DIAGNOSIS — J45909 Unspecified asthma, uncomplicated: Secondary | ICD-10-CM | POA: Insufficient documentation

## 2019-11-08 DIAGNOSIS — R369 Urethral discharge, unspecified: Secondary | ICD-10-CM | POA: Insufficient documentation

## 2019-11-08 DIAGNOSIS — Z87891 Personal history of nicotine dependence: Secondary | ICD-10-CM | POA: Insufficient documentation

## 2019-11-08 LAB — URINALYSIS, ROUTINE W REFLEX MICROSCOPIC
Bilirubin Urine: NEGATIVE
Glucose, UA: NEGATIVE mg/dL
Ketones, ur: NEGATIVE mg/dL
Nitrite: NEGATIVE
Protein, ur: NEGATIVE mg/dL
RBC / HPF: >50 RBC/hpf — ABNORMAL HIGH (ref 0–5)
Specific Gravity, Urine: 1.02 (ref 1.005–1.030)
WBC, UA: >50 WBC/hpf — ABNORMAL HIGH (ref 0–5)
pH: 6 (ref 5.0–8.0)

## 2019-11-08 LAB — GROUP A STREP BY PCR: Group A Strep by PCR: NOT DETECTED

## 2019-11-08 MED ORDER — GENTAMICIN SULFATE 40 MG/ML IJ SOLN
240.0000 mg | Freq: Once | INTRAMUSCULAR | Status: AC
  Administered 2019-11-08: 240 mg via INTRAMUSCULAR
  Filled 2019-11-08: qty 6

## 2019-11-08 MED ORDER — NEOMYCIN-POLYMYXIN-HC 3.5-10000-1 OT SUSP: 4.0000 [drp] | Freq: Three times a day (TID) | OTIC | 0 refills | Status: DC

## 2019-11-08 MED ORDER — AZITHROMYCIN 1 G PO PACK
2.0000 g | PACK | Freq: Once | ORAL | Status: DC
  Filled 2019-11-08: qty 2

## 2019-11-08 MED ORDER — AZITHROMYCIN 1 G PO PACK
1.0000 g | PACK | Freq: Once | ORAL | Status: AC
  Administered 2019-11-08: 1 g via ORAL

## 2019-11-08 NOTE — ED Triage Notes (Addendum)
Patient c/o left ear pain x1 week with previous ear infection this year in march. 5/10 intermitten achy  Patient reports he needs STD check.  C/o urinary burning, penile discharge with small blood tinge X2 days.   Patient also reports that he was told he stops breathing when he sleeps.    A/ox4 Ambulatory in triage.

## 2019-11-08 NOTE — ED Provider Notes (Signed)
[facility] DEPT Provider Note   CSN: [id number] Arrival date & time: 11/08/19  1751     History   Chief Complaint Chief Complaint  Patient presents with  . Otalgia    HPI Kurt Glass is a 33 y.o. male with history of asthma who presents with a 1 week history of left ear pain and 2-day history of dysuria, and penile discharge.  Patient reports his penile discharge has been yellow and bloody.  This occurred after having unprotected sex with his girlfriend.  Patient's ear pain has been present over the last week.  He reports having an ear infection earlier this year.  He denies any drainage.  He reports some associated left sided tooth pain, which he also had in March.  Patient denies any fever, congestion, sore throat, abdominal pain, nausea, vomiting chest pain, shortness of breath.  Patient does note that his family members have told him that he stops breathing for 10 to 15 seconds while he sleeps.  He also snores very loudly.     HPI  Past Medical History:  Diagnosis Date  . Asthma     There are no active problems to display for this patient.   History reviewed. No pertinent surgical history.      Home Medications    Prior to Admission medications   Medication Sig Start Date End Date Taking? Authorizing Provider  azithromycin (ZITHROMAX) 250 MG tablet Take 1 tablet (250 mg total) by mouth daily. Take first 2 tablets together, then 1 every day until finished. 02/12/19   Ashley Murrain, NP  naproxen (NAPROSYN) 500 MG tablet Take 1 tablet (500 mg total) by mouth 2 (two) times daily. 02/12/19   Ashley Murrain, NP  neomycin-polymyxin-hydrocortisone (CORTISPORIN) 3.5-10000-1 OTIC suspension Place 4 drops into the left ear 3 (three) times daily. 11/08/19   Alandra Sando, Bea Graff, PA-C  predniSONE (DELTASONE) 10 MG tablet Take 4 tablets (40 mg total) by mouth daily with breakfast. 02/12/19   Ashley Murrain, NP    Family History Family History   Problem Relation Age of Onset  . Cancer Other   . Asthma Other     Social History Social History   Tobacco Use  . Smoking status: Former Smoker    Packs/day: 1.00    Types: Cigarettes  . Smokeless tobacco: Never Used  Substance Use Topics  . Alcohol use: Yes    Comment: social  . Drug use: No     Allergies   Penicillins   Review of Systems Review of Systems  Constitutional: Negative for chills and fever.  HENT: Positive for ear pain. Negative for facial swelling and sore throat.   Respiratory: Negative for shortness of breath.   Cardiovascular: Negative for chest pain.  Gastrointestinal: Negative for abdominal pain, nausea and vomiting.  Genitourinary: Positive for discharge, dysuria and hematuria. Negative for penile swelling, scrotal swelling and testicular pain.  Musculoskeletal: Negative for back pain.  Skin: Negative for rash and wound.  Neurological: Negative for headaches.  Psychiatric/Behavioral: The patient is not nervous/anxious.      Physical Exam Updated Vital Signs BP (!) 168/89   Pulse 61   Temp 98.3 F (36.8 C) (Oral)   Resp 19   SpO2 98%   Physical Exam Vitals signs and nursing note reviewed. Exam conducted with a chaperone present.  Constitutional:      General: He is not in acute distress.    Appearance: He is well-developed. He is not diaphoretic.  HENT:     Head: Normocephalic and atraumatic.     Right Ear: Tympanic membrane normal.     Left Ear: There is impacted cerumen.     Mouth/Throat:     Pharynx: No oropharyngeal exudate.  Eyes:     General: No scleral icterus.       Right eye: No discharge.        Left eye: No discharge.     Conjunctiva/sclera: Conjunctivae normal.     Pupils: Pupils are equal, round, and reactive to light.  Neck:     Musculoskeletal: Normal range of motion and neck supple.     Thyroid: No thyromegaly.  Cardiovascular:     Rate and Rhythm: Normal rate and regular rhythm.     Heart sounds: Normal heart  sounds. No murmur. No friction rub. No gallop.   Pulmonary:     Effort: Pulmonary effort is normal. No respiratory distress.     Breath sounds: Normal breath sounds. No stridor. No wheezing or rales.  Abdominal:     General: Bowel sounds are normal. There is no distension.     Palpations: Abdomen is soft.     Tenderness: There is no abdominal tenderness. There is no guarding or rebound.  Genitourinary:    Penis: Discharge (bloody, yellow) present.      Scrotum/Testes: Normal.        Right: Tenderness not present.        Left: Tenderness not present.     Comments: No perineal tenderness Lymphadenopathy:     Cervical: No cervical adenopathy.  Skin:    General: Skin is warm and dry.     Coloration: Skin is not pale.     Findings: No rash.  Neurological:     Mental Status: He is alert.     Coordination: Coordination normal.      ED Treatments / Results  Labs (all labs ordered are listed, but only abnormal results are displayed) Labs Reviewed  URINALYSIS, ROUTINE W REFLEX MICROSCOPIC - Abnormal; Notable for the following components:      Result Value   APPearance HAZY (*)    Hgb urine dipstick MODERATE (*)    Leukocytes,Ua LARGE (*)    RBC / HPF >50 (*)    WBC, UA >50 (*)    Bacteria, UA RARE (*)    All other components within normal limits  GROUP A STREP BY PCR  URINE CULTURE  GC/CHLAMYDIA PROBE AMP ([place]) NOT AT [facility]    EKG None  Radiology No results found.  Procedures Procedures (including critical care time)  Medications Ordered in ED Medications  gentamicin (GARAMYCIN) injection 240 mg (240 mg Intramuscular Given 11/08/19 2333)  azithromycin (ZITHROMAX) powder 1 g (1 g Oral Given 11/08/19 2320)     Initial Impression / Assessment and Plan / ED Course  I have reviewed the triage vital signs and the nursing notes.  Pertinent labs & imaging results that were available during my care of the patient were reviewed by me and considered in my medical  decision making (see chart for details).        Patient presenting with multiple complaints.  Left ear pain suspicious from cerumen impaction versus foreign body.  Unable to remove entire amount and unable to visualize TM, however patient improved and has no ear pain after removal of some.  Will treat with Cortisporin.  Considering no pain after removal, doubt otitis media at this time.  Will refer to ENT for further evaluation  and removal of unreachable material.  Patient with penile discharge and dysuria.  UA shows large leukocytes, moderate hematuria, white blood cell clumps.  Urine culture sent.  However, suspect STD as cause and will treat with azithromycin and gentamicin considering patient's penicillin allergy and he does not remember being treated with Rocephin in the past.  GC/chlamydia sent and pending.  If urine culture results and something other than above, would treat for UTI.  Patient advised to establish care with PCP for recheck of blood pressure and referral for sleep study, as patient reports he snores a lot and stops breathing in his sleep.  Suspect sleep apnea.  Patient advised the importance of following up in getting this checked out and the risks of untreated sleep apnea.  Return precautions discussed.  Patient vitals stable throughout ED course and discharged in satisfactory condition.  Final Clinical Impressions(s) / ED Diagnoses   Final diagnoses:  Impacted cerumen of left ear  Dysuria  Penile discharge    ED Discharge Orders         Ordered    neomycin-polymyxin-hydrocortisone (CORTISPORIN) 3.5-10000-1 OTIC suspension  3 times daily     11/08/19 2214           Emi HolesLaw, Vesta Wheeland M, PA-C 11/08/19 2347    Glynn Octaveancour, Stephen, MD 11/09/19 1029

## 2019-11-08 NOTE — Discharge Instructions (Addendum)
You have been treated for gonorrhea and chlamydia today. You will be called in 3 days if any of your tests return positive. In that case, please make all of your sexual partners aware that they will need to be treated as well. Abstain from intercourse for one week until you have both been treated. Use condoms in the future to help prevent sexually transmitted disease and unwanted pregnancy.   You will be called if your urine culture requires any further antibiotics.  Use Cortisporin for 5 days as prescribed to help prevent ear canal infection.  Please follow-up with the ear, nose, throat doctor, Dr. Janace Hoard, for further evaluation and treatment of the the foreign body versus wax stuck in your ear.  Please return the emergency department if you develop any new or worsening symptoms.  Please follow-up with the primary care provider below for further evaluation and treatment and referral to have a sleep study regarding your probable sleep apnea.

## 2019-11-08 NOTE — ED Notes (Signed)
Urine culture-clean catch.

## 2019-11-10 LAB — URINE CULTURE: Culture: NO GROWTH

## 2019-11-12 LAB — GC/CHLAMYDIA PROBE AMP (~~LOC~~) NOT AT ARMC
Chlamydia: NEGATIVE
Neisseria Gonorrhea: POSITIVE — AB

## 2020-12-19 DIAGNOSIS — Z8616 Personal history of COVID-19: Secondary | ICD-10-CM

## 2020-12-19 HISTORY — DX: Personal history of COVID-19: Z86.16

## 2022-01-21 ENCOUNTER — Other Ambulatory Visit: Payer: Self-pay | Admitting: Orthopedic Surgery

## 2022-01-21 DIAGNOSIS — S73191A Other sprain of right hip, initial encounter: Secondary | ICD-10-CM

## 2022-01-21 DIAGNOSIS — S83242A Other tear of medial meniscus, current injury, left knee, initial encounter: Secondary | ICD-10-CM

## 2022-01-30 ENCOUNTER — Ambulatory Visit
Admission: RE | Admit: 2022-01-30 | Discharge: 2022-01-30 | Disposition: A | Discharge status: Home / Self Care | Payer: BC Managed Care – PPO | Source: Ambulatory Visit | Attending: Orthopedic Surgery | Admitting: Orthopedic Surgery

## 2022-01-30 ENCOUNTER — Other Ambulatory Visit: Payer: Self-pay

## 2022-01-30 DIAGNOSIS — S83242A Other tear of medial meniscus, current injury, left knee, initial encounter: Secondary | ICD-10-CM

## 2022-01-30 IMAGING — MR MR KNEE*L* W/O CM
4 of 7 series · 22 of 40 positions shown · non-contrast
Comparison: Radiographs dated [DATE]

CLINICAL DATA: Anteromedial knee pain.

EXAM:
MRI OF THE LEFT KNEE WITHOUT CONTRAST
TECHNIQUE: Multiplanar, multisequence MR imaging of the knee was performed. No
intravenous contrast was administered.

[Series 3: T2 fat-sat · axial · 4.0mm · 0.53mm/px · z∈[-94,+61]mm · 6 of 32 slices shown]
[im 1/32]
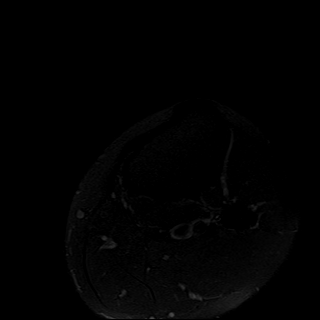
[im 7/32]
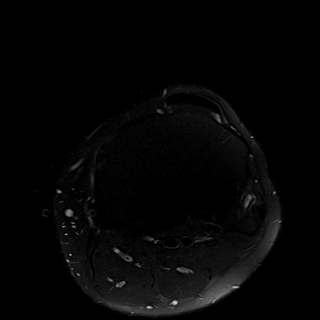
[im 13/32]
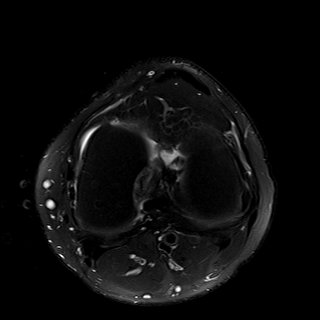
[im 19/32]
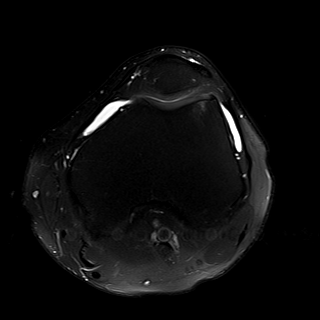
[im 25/32]
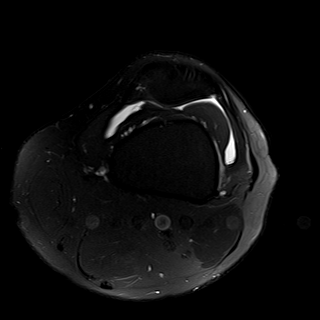
[im 32/32]
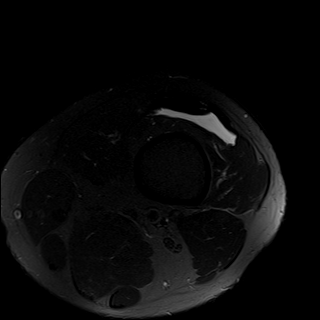

[Series 6: PD fat-sat · coronal · 3.3mm · 0.33mm/px · 7 of 33 slices shown (1 of 3)]
[im 1/33]
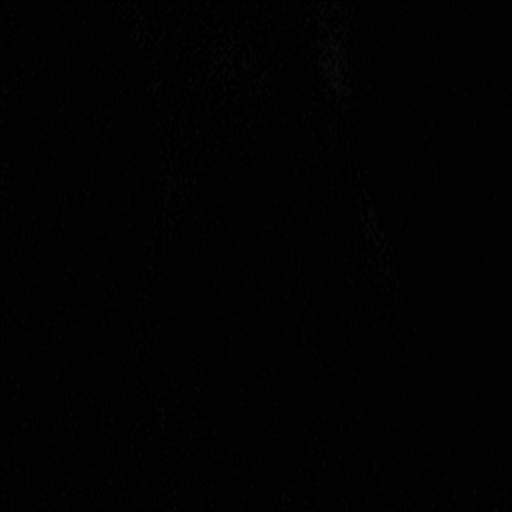
[im 6/33]
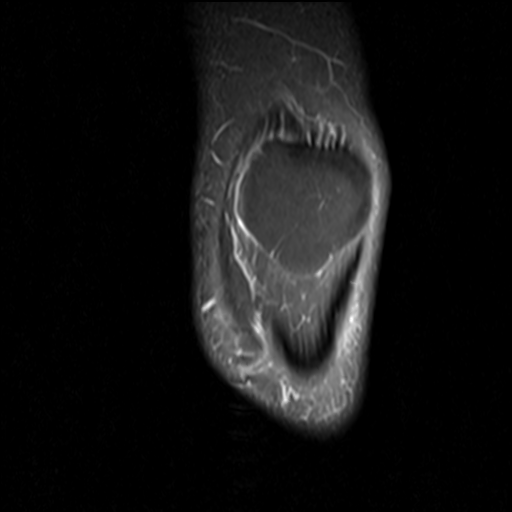
[im 11/33]
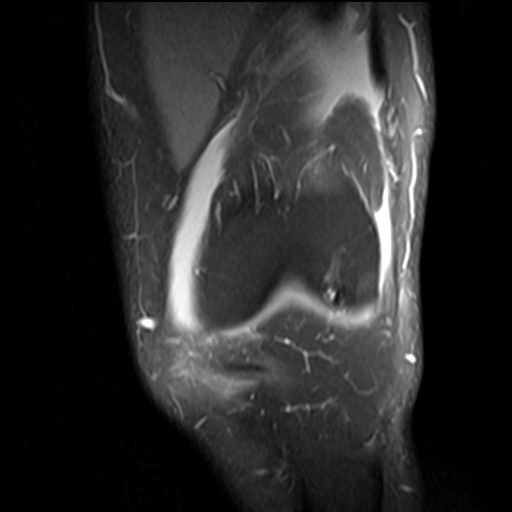
[im 17/33]
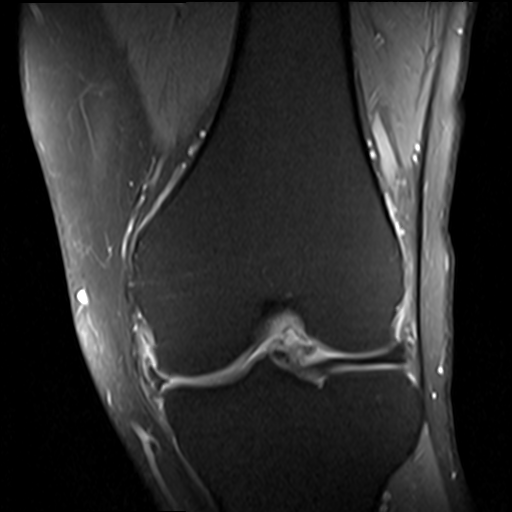
[im 22/33]
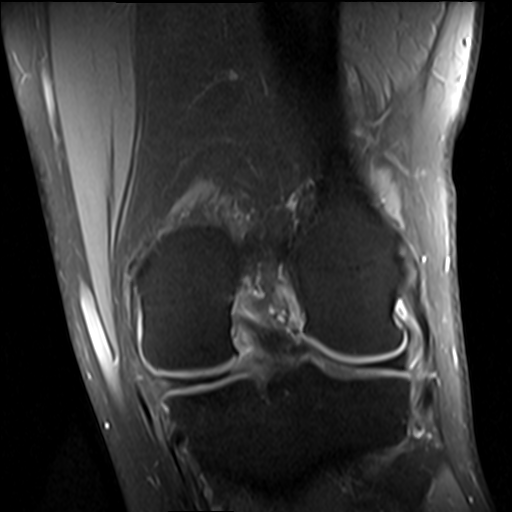
[im 27/33]
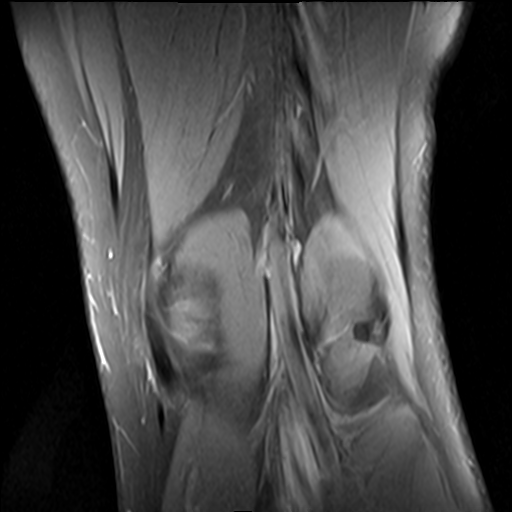
[im 33/33]
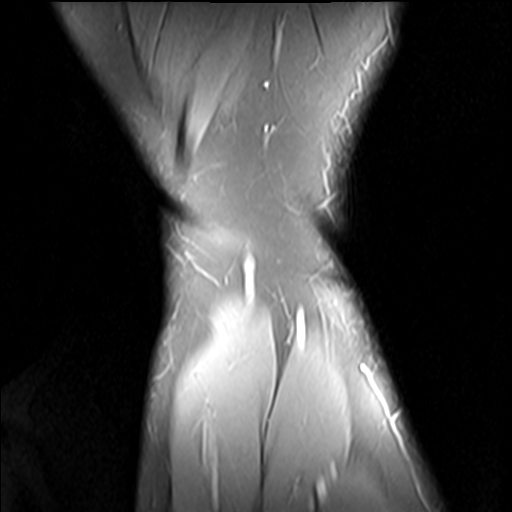

[Series 8: PD fat-sat · sagittal · 3.5mm · 0.33mm/px · 6 of 30 slices shown (2 of 3)]
[im 1/30]
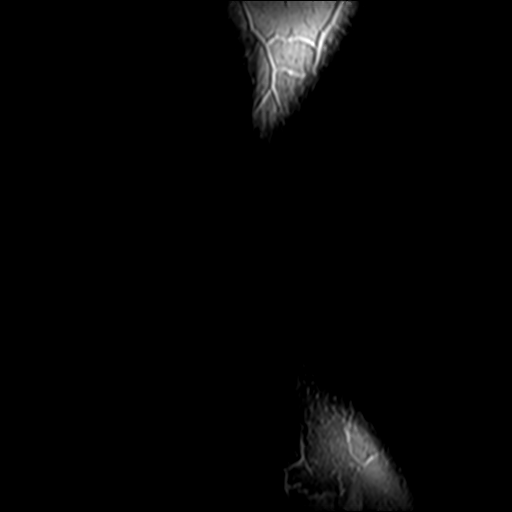
[im 6/30]
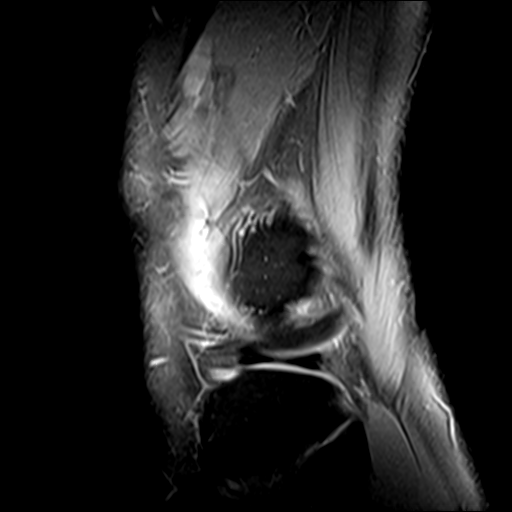
[im 12/30]
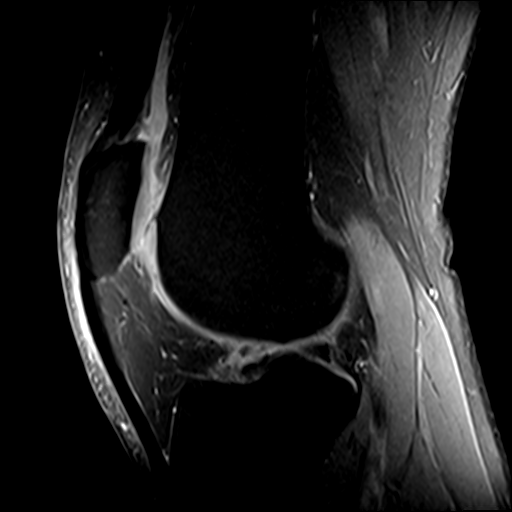
[im 18/30]
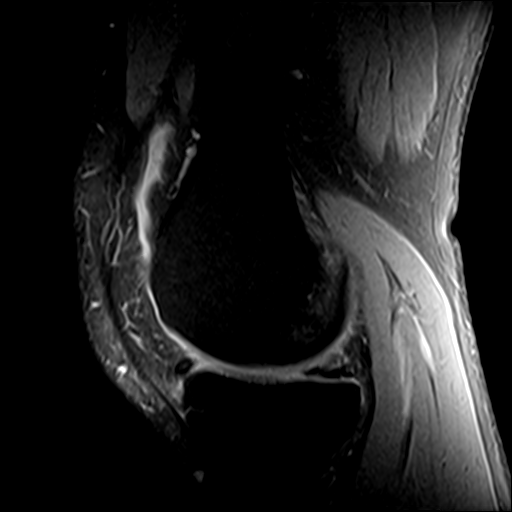
[im 24/30]
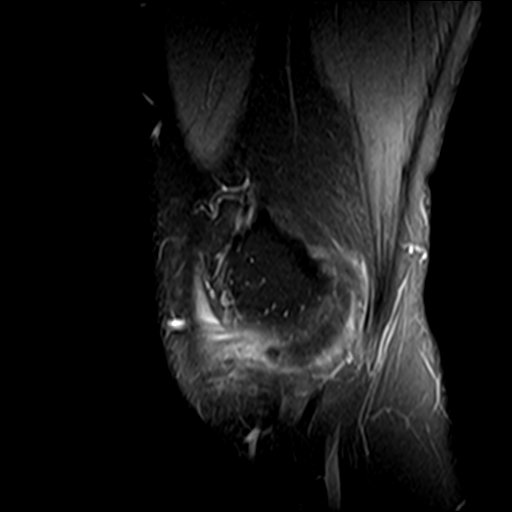
[im 30/30]
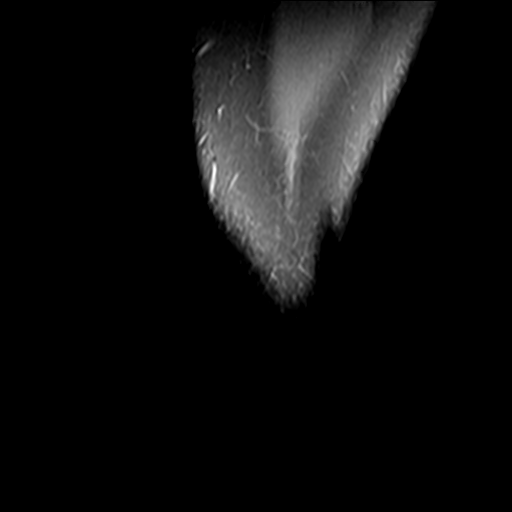

[Series 9: PD fat-sat · oblique · 2.3mm · 0.29mm/px · 3 of 13 slices shown (3 of 3)]
[im 1/13]
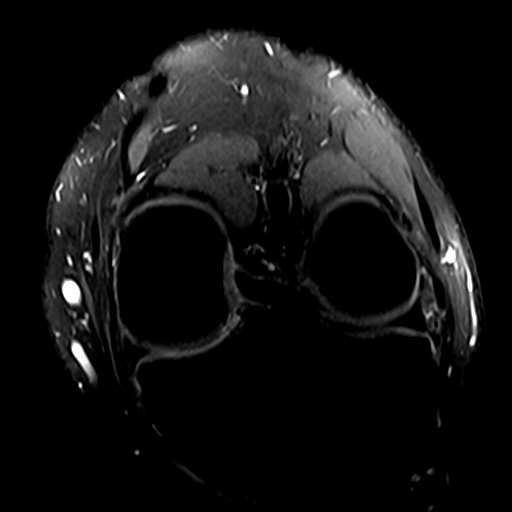
[im 7/13]
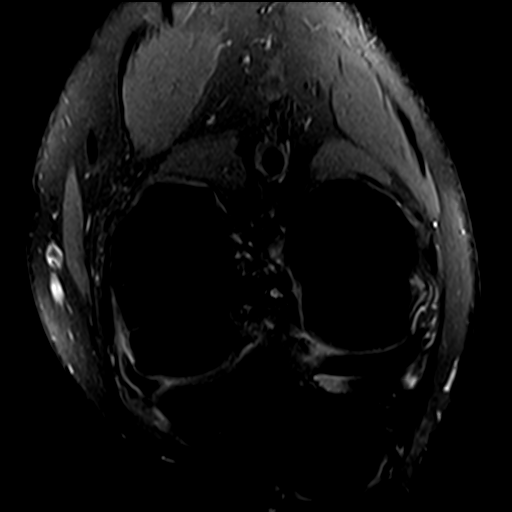
[im 13/13]
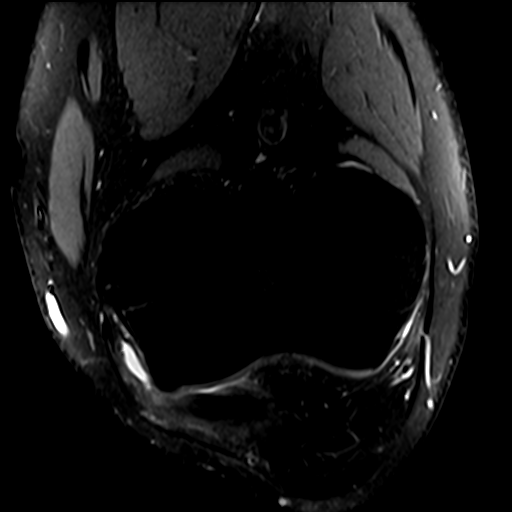

[22 of 40 positions shown; findings below may reference images not displayed]

FINDINGS: MENISCI

Medial: Horizontal oblique tear of the posterior horn of the medial
meniscus.

Lateral: Intact.

LIGAMENTS

Cruciates: ACL and PCL are intact.

Collaterals: Medial collateral ligament is intact. Lateral
collateral ligament complex is intact.

CARTILAGE

Patellofemoral: Multiple full-thickness chondral defect with
subchondral edema in the lateral trochlea. There is articular
cartilage thinning of the lateral patellar facet without
full-thickness defect.

Medial:  No chondral defect.

Lateral:  No chondral defect.

JOINT: Moderate joint effusion. Trace edema in the lateral aspect of
the Hoffa's fat pad. No plical thickening.

POPLITEAL FOSSA: Popliteus tendon is intact. No Baker's cyst.

EXTENSOR MECHANISM: Intact quadriceps tendon. Intact patellar
tendon. Intact lateral patellar retinaculum. Intact medial patellar
retinaculum. Intact MPFL.

BONES: No aggressive osseous lesion. No fracture or dislocation.

Other: The tibial tuberosity-trochlear groove (TT-TG) distances
approximately 1.7 cm which in the presence of chondral injuries as
well as mild edema in the lateral Hoffa's fat pad concerning for
patellar maltracking.

No fluid collection or hematoma. Muscles are normal.
IMPRESSION: 1. Horizontal oblique tear of the posterior horn of the medial
meniscus.

2. Full-thickness cartilage defect with subchondral edema in the
lateral trochlea with moderate joint effusion. The TT -TG distance
measures approximately 1.7 cm, the findings are concerning for
patellar maltracking.

## 2022-02-06 DIAGNOSIS — K047 Periapical abscess without sinus: Secondary | ICD-10-CM

## 2022-02-06 HISTORY — DX: Periapical abscess without sinus: K04.7

## 2022-02-08 ENCOUNTER — Ambulatory Visit
Admission: RE | Admit: 2022-02-08 | Discharge: 2022-02-08 | Disposition: A | Discharge status: Home / Self Care | Payer: Self-pay | Source: Ambulatory Visit | Attending: Orthopedic Surgery | Admitting: Orthopedic Surgery

## 2022-02-08 ENCOUNTER — Other Ambulatory Visit: Payer: Self-pay

## 2022-02-08 DIAGNOSIS — S73191A Other sprain of right hip, initial encounter: Secondary | ICD-10-CM

## 2022-02-08 IMAGING — XA DG FLUORO GUIDE NDL PLC/BX
1 series · 1 of 1 positions shown · non-contrast
Comparison: none

CLINICAL DATA: Tear of right acetabular labrum.  Hip pain.

[Series 1: ortho standard · 1 of 1 slices shown]
[im 1/1]
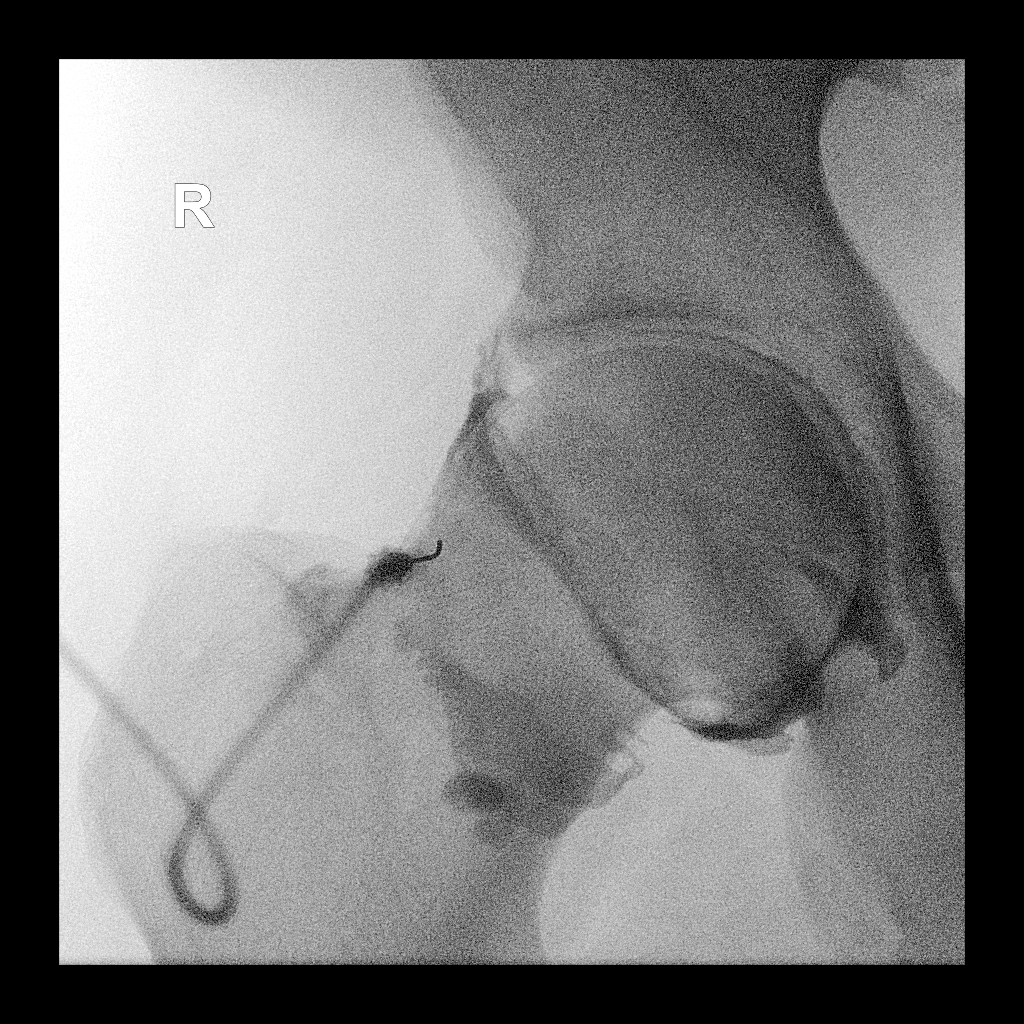

[1 of 1 positions shown; findings below may reference images not displayed]

EXAM:
RIGHT HIP INJECTION FOR MRI

FLUOROSCOPY:
Radiation Exposure Index (as provided by the fluoroscopic device):
1.40 mGy Kerma

PROCEDURE:
Overlying skin prepped with Betadine, draped in the usual sterile
fashion, and infiltrated locally with 1% lidocaine. A 22 gauge
spinal needle was advanced to the lateral aspect of the right
femoral head-neck junction. 1 mL of 1% lidocaine injected easily. A
mixture of 0.1 mL of MultiHance, 10 mL of Isovue-M 300, and 10 mL of
sterile saline was then used to opacify the right femoral head. 13
mL of this mixture were injected. No immediate complication.
IMPRESSION: Technically successful right hip injection under fluoroscopy for MR
arthrogram.

## 2022-02-08 IMAGING — MR MR HIP*R* W/CM
4 of 6 series · 16 of 40 positions shown · IV contrast (agent unspecified)
Comparison: None.

CLINICAL DATA: Right hip pain, injured in [LJ].

EXAM:
MRI OF THE RIGHT HIP WITH CONTRAST (MR Arthrogram)
TECHNIQUE: Multiplanar, multisequence MR imaging of the hip was performed
immediately following contrast injection into the hip joint under
fluoroscopic guidance. No intravenous contrast was administered.

[Series 4: T1 · coronal · 4.0mm · 0.62mm/px · 6 of 32 slices shown]
[im 1/32]
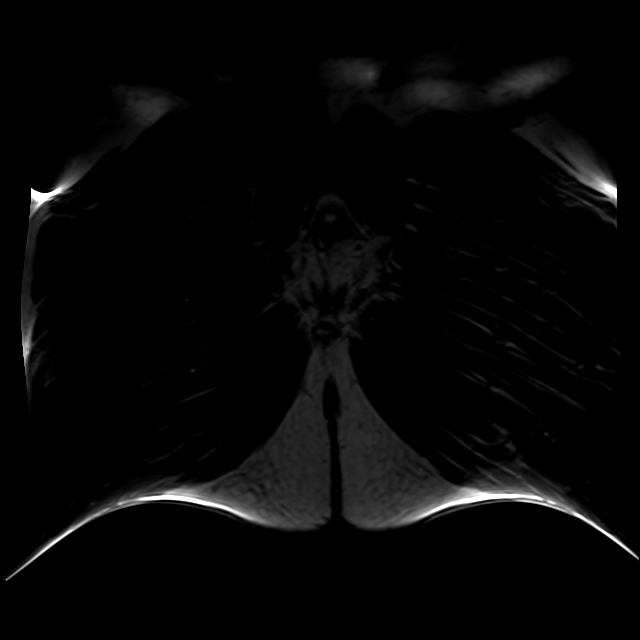
[im 7/32]
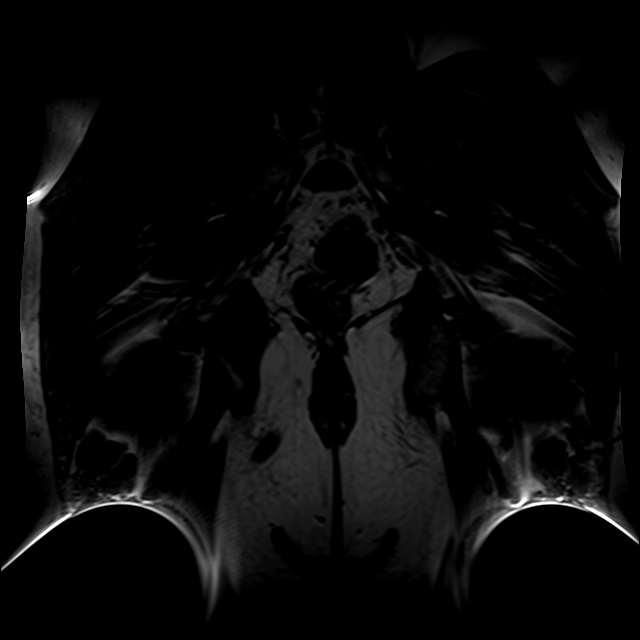
[im 13/32]
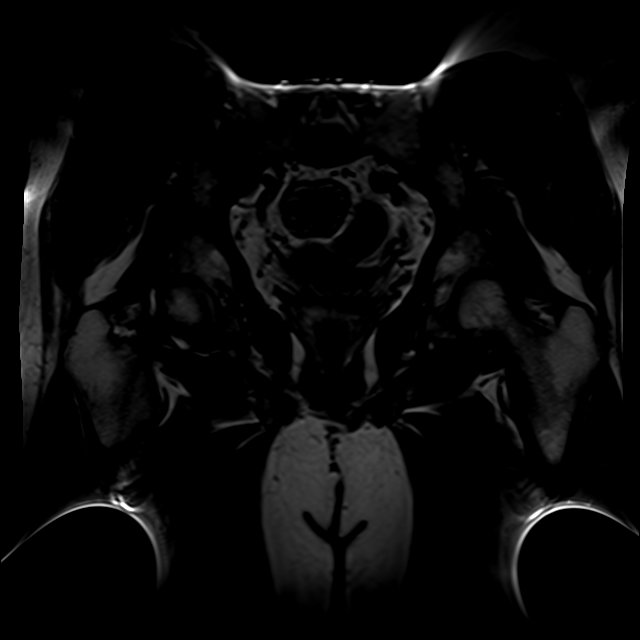
[im 19/32]
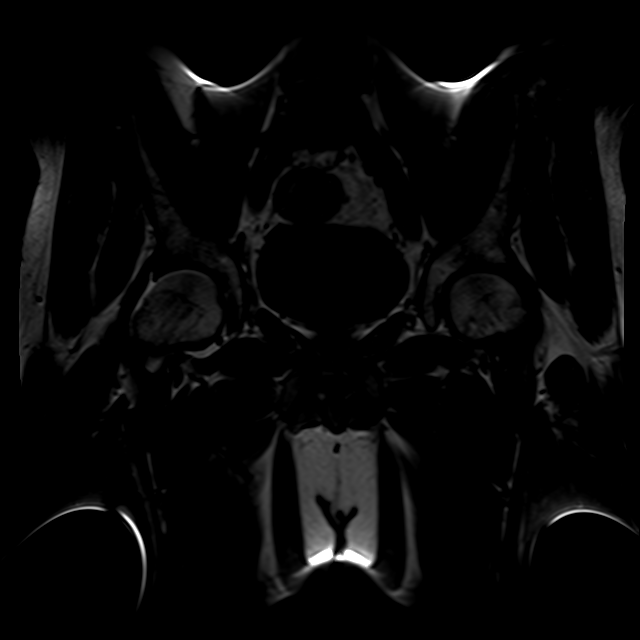
[im 25/32]
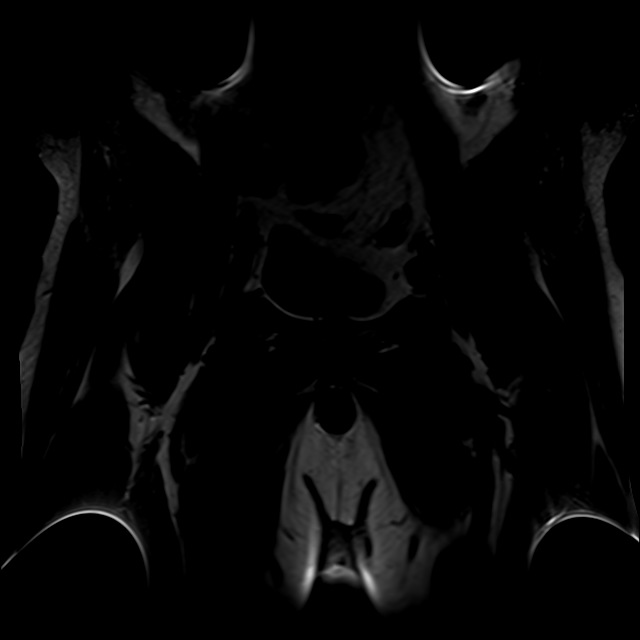
[im 32/32]
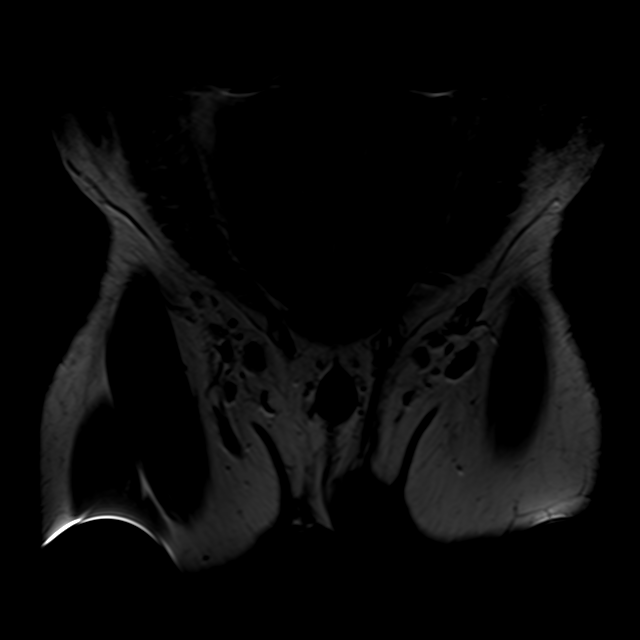

[Series 5: T2 fat-sat · coronal · 4.0mm · 0.62mm/px · 4 of 32 slices shown (1 of 2)]
[im 1/32]
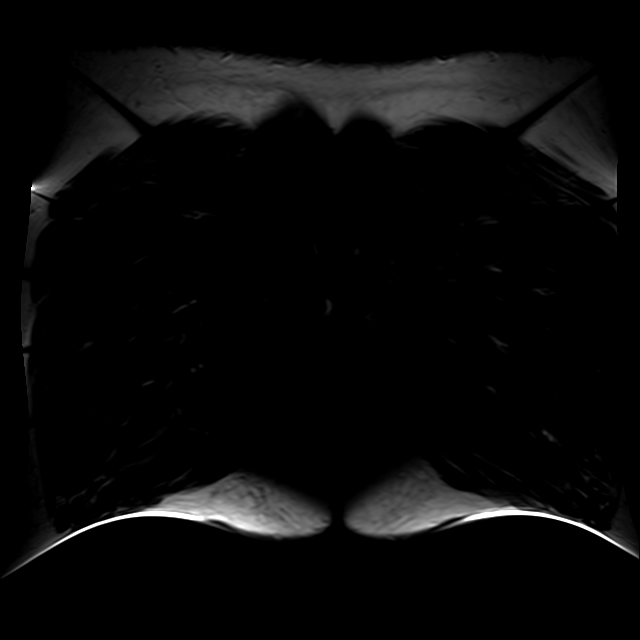
[im 7/32]
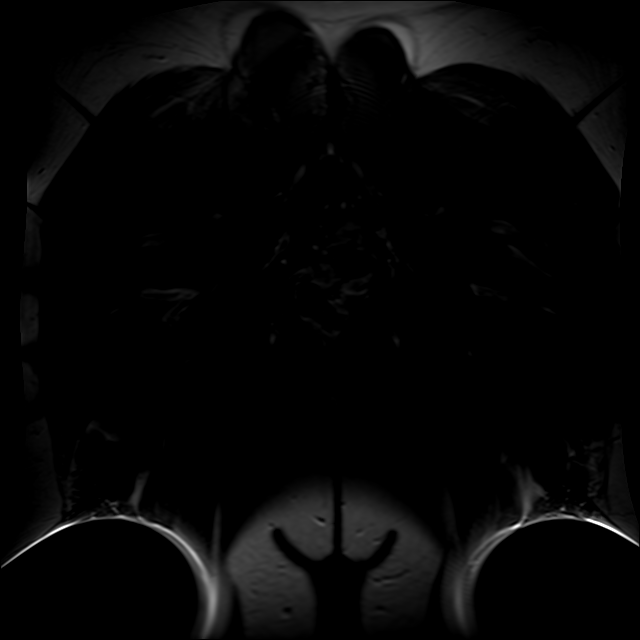
[im 19/32]
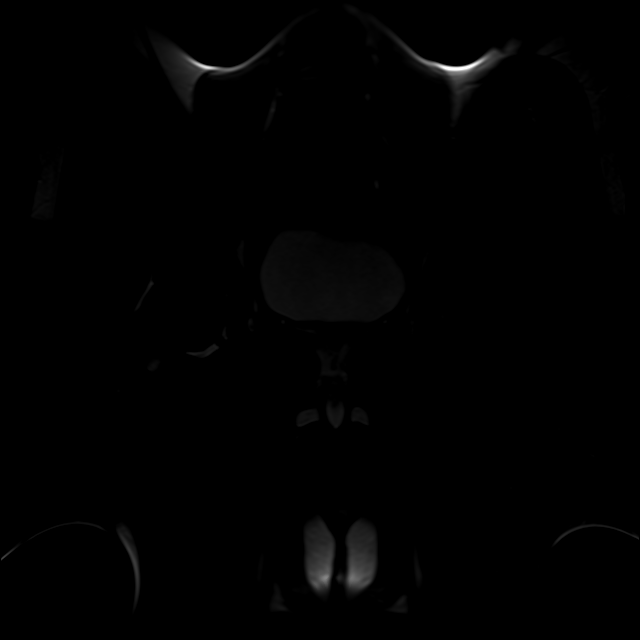
[im 32/32]
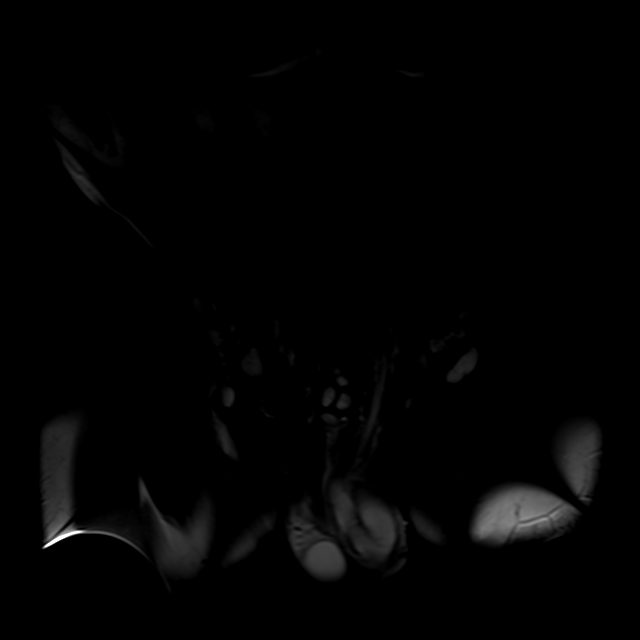

[Series 6: T2 fat-sat · axial · 4.0mm · 0.88mm/px · z∈[-66,+59]mm · 3 of 37 slices shown (2 of 2)]
[im 6/37]
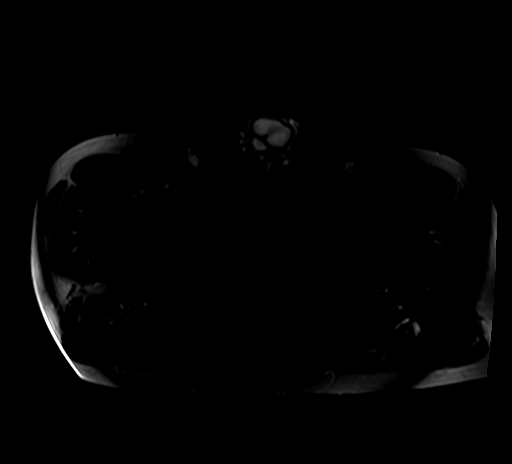
[im 21/37]
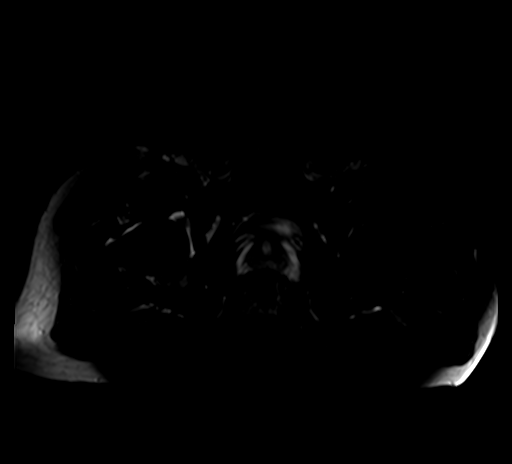
[im 31/37]
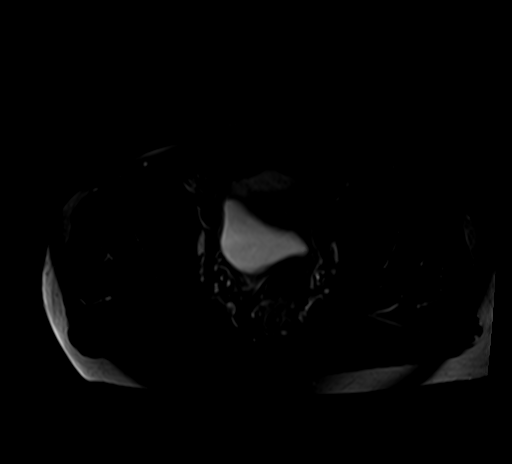

[Series 7: T1 fat-sat · oblique · 4.0mm · 0.78mm/px · 3 of 35 slices shown]
[im 6/35]
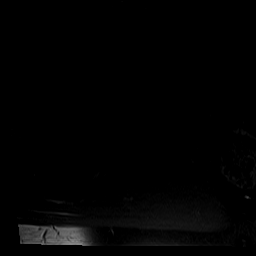
[im 18/35]
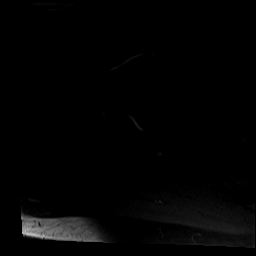
[im 29/35]
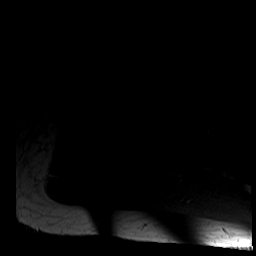

[16 of 40 positions shown; findings below may reference images not displayed]

FINDINGS: Bone

No hip fracture, dislocation or avascular necrosis. No aggressive
osseous lesion.

SI joints are normal. No SI joint widening or erosive changes.

Degenerative disease with disc height loss at L5-S1.

Alignment

Normal. No subluxation.

Dysplasia

Slight osseous prominence of the right superior femoral head and
neck junction bilaterally, left more prominent than right.

Joint effusion

Intraarticular contrast distends the right hip joint capsule.
Otherwise, no joint effusions.

Labrum

Right superior labral tear. Attenuation of the right anterior
labrum. Degeneration of the right posterior labrum with a tear.

Cartilage

Partial-thickness cartilage loss of the femoral head and acetabulum
with high-grade partial-thickness cartilage loss along the posterior
aspect of the joint with subchondral marrow edema in the posterior
acetabulum. Partial-thickness cartilage loss of the left femoral
head and acetabulum.

Capsule and ligaments

Normal.

Muscles and Tendons

Flexors: Normal.

Extensors: Normal.

Abductors: Normal.

Adductors: Normal.

Rotators: Normal.

Hamstrings: Normal.

Other Findings

No bursal fluid.

Viscera

No abnormality seen in pelvis. No lymphadenopathy. No free fluid in
the pelvis.
IMPRESSION: 1. Right superior labral tear. Attenuation of the right anterior
labrum. Degeneration of the right posterior labrum with a tear.
2. Partial-thickness cartilage loss of the femoral head and
acetabulum with high-grade partial-thickness cartilage loss along
the posterior aspect of the joint with subchondral marrow edema in
the posterior acetabulum.
3. Partial-thickness cartilage loss of the left femoral head and
acetabulum.

## 2022-02-08 MED ORDER — IOPAMIDOL (ISOVUE-M 300) INJECTION 61%
13.0000 mL | Freq: Once | INTRAMUSCULAR | Status: AC | PRN
  Administered 2022-02-08: 13 mL via INTRA_ARTICULAR

## 2022-02-16 NOTE — H&P (Signed)
Subjective: ?Kurt Glass is a 36 year-old male who presents for evaluation of left medial knee pain and right anterior groin pain.  He states that the symptoms have been going on in the right hip for approximately one year.  He enjoys playing recreational basketball and reports an injury approximately a year ago where he feels like he strained his right groin during a basketball game and has had achy soreness in the groin area ever since that has been exacerbated with exercise and basketball.  He rates the pain as 7/10 with activities.  Minimal pain when at rest.  He notes a catching sensation in the groin with movement.  In regards to the left knee he reports his pain occurred as well during another basketball game just over a month ago and has been getting progressively worse.  He localizes the pain to the medial side of the knee.  He feels a popping and catching sensation on the medial aspect of the knee.  He notes moderate swelling of the knee that has been getting gradually better, but the pain continues with activities.  He has been taking Tylenol and Ibuprofen as needed.  He has been exercising, but limited due to the pain in both the left knee and right groin area.  He has tried a brace to the left knee, but it is not providing any help.   ? ?EXAMINATION: ?Height: 6?4?Marland Kitchen  Weight: 281 pounds.  In regards to the left knee, he has a mild knee effusion.  He has range of motion 0-122 degrees.  He has tenderness to the medial joint line.  He has a positive McMurray with varus stress.  His knee is stable in extension to varus and valgus stress with a negative anterior and posterior drawer.  He has intact active knee extension.  Negative patella apprehension.  He has no pain with hip flexion or internal rotation.  In regards to the right hip, he has hip flexion up to 90 degrees with internal rotation of 15 degrees and external rotation of 30 degrees.  He has pain with hip flexion and internal rotation, as well as a  positive Stinchfield with pain in the right groin area with resisted hip flexion.  He has full painless right knee range of motion with no knee effusion.  He ambulates with a mildly antalgic gait and Trendelenburg gait of the right hip.  He has 5/5 strength of bilateral hip flexion, knee extension, ankle dorsiflexion and plantarflexion.  Sensation intact distally below the knees.    ? ?X-RAYS: ?X-rays of the pelvis and right hip obtained today demonstrate mild bilateral joint space narrowing of the hip joints with an inferior femoral head osteophyte of the right hip.  Otherwise no fracture, dislocation or other osseous abnormalities. ?X-rays of the left knee demonstrates preserved joint space without significant degenerative changes.  No fracture, dislocation or other osseous abnormalities.  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 ? 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 ? ?PLAN: ?Based on patient's examination and imaging findings he has a symptomatic medial meniscus tear.  Notably he also has a full-thickness cartilage defect of the trochlea.  We will plan to address the meniscal tear with a partial meniscectomy.  We will also perform a chondroplasty of the trochlea.  Discussed that how he does after surgery following this procedure if he remains symptomatic related to the trochlear damage he may benefit from an osteochondral allograft transfer type procedure to address to the cartilage defect on the trochlea in the future.  Discussed that this would be a much more invasive surgery with a higher complication rate and longer recovery time and would not recommend doing this as part of the index procedure. The risks benefits and alternatives  were discussed with the patient including but not limited to the risks of nonoperative treatment, versus surgical intervention including infection, bleeding, nerve injury, the need for revision surgery, blood clots, cardiopulmonary complications, morbidity, mortality, among others, and they were willing to proceed.    ?

## 2022-02-22 ENCOUNTER — Encounter (HOSPITAL_BASED_OUTPATIENT_CLINIC_OR_DEPARTMENT_OTHER): Payer: Self-pay | Admitting: Orthopedic Surgery

## 2022-02-22 ENCOUNTER — Other Ambulatory Visit: Payer: Self-pay

## 2022-02-22 NOTE — Progress Notes (Signed)
Spoke w/ via phone for pre-op interview---pt ?Lab needs dos----   none            ?Lab results------none ?COVID test -----patient states asymptomatic no test needed ?Arrive at -------815 am 02-25-2022 ?NPO after MN NO Solid Food.  Clear liquids from MN until---715 am ?Med rec completed ?Medications to take morning of surgery -----none ?Diabetic medication -----n/a ?Patient instructed no nail polish to be worn day of surgery ?Patient instructed to bring photo id and insurance card day of surgery ?Patient aware to have Driver (ride ) / caregiver    for 24 hours after surgery  pt aware driver/caregiver age 69 and above needed will arrange ?Patient Special Instructions -----none ?Pre-Op special Istructions -----none ?Patient verbalized understanding of instructions that were given at this phone interview. ?Patient denies shortness of breath, chest pain, fever, cough at this phone interview.  ?

## 2022-02-25 ENCOUNTER — Encounter (HOSPITAL_BASED_OUTPATIENT_CLINIC_OR_DEPARTMENT_OTHER)
Admission: RE | Disposition: A | Discharge status: Home / Self Care | Payer: Self-pay | Source: Home / Self Care | Attending: Orthopedic Surgery

## 2022-02-25 ENCOUNTER — Encounter (HOSPITAL_BASED_OUTPATIENT_CLINIC_OR_DEPARTMENT_OTHER): Payer: Self-pay | Admitting: Orthopedic Surgery

## 2022-02-25 ENCOUNTER — Ambulatory Visit (HOSPITAL_BASED_OUTPATIENT_CLINIC_OR_DEPARTMENT_OTHER): Payer: BC Managed Care – PPO | Admitting: Anesthesiology

## 2022-02-25 ENCOUNTER — Ambulatory Visit (HOSPITAL_BASED_OUTPATIENT_CLINIC_OR_DEPARTMENT_OTHER)
Admission: RE | Admit: 2022-02-25 | Discharge: 2022-02-25 | Disposition: A | Discharge status: Home / Self Care | Payer: BC Managed Care – PPO | Attending: Orthopedic Surgery | Admitting: Orthopedic Surgery

## 2022-02-25 ENCOUNTER — Other Ambulatory Visit: Payer: Self-pay

## 2022-02-25 DIAGNOSIS — S83242A Other tear of medial meniscus, current injury, left knee, initial encounter: Secondary | ICD-10-CM | POA: Insufficient documentation

## 2022-02-25 DIAGNOSIS — M25462 Effusion, left knee: Secondary | ICD-10-CM | POA: Diagnosis not present

## 2022-02-25 DIAGNOSIS — M94262 Chondromalacia, left knee: Secondary | ICD-10-CM | POA: Insufficient documentation

## 2022-02-25 DIAGNOSIS — Y9367 Activity, basketball: Secondary | ICD-10-CM | POA: Insufficient documentation

## 2022-02-25 DIAGNOSIS — J45909 Unspecified asthma, uncomplicated: Secondary | ICD-10-CM | POA: Diagnosis not present

## 2022-02-25 DIAGNOSIS — Z87891 Personal history of nicotine dependence: Secondary | ICD-10-CM | POA: Insufficient documentation

## 2022-02-25 DIAGNOSIS — S83512A Sprain of anterior cruciate ligament of left knee, initial encounter: Secondary | ICD-10-CM | POA: Diagnosis not present

## 2022-02-25 DIAGNOSIS — M1990 Unspecified osteoarthritis, unspecified site: Secondary | ICD-10-CM | POA: Diagnosis not present

## 2022-02-25 HISTORY — DX: Unspecified asthma, uncomplicated: J45.909

## 2022-02-25 HISTORY — DX: Other tear of medial meniscus, current injury, unspecified knee, initial encounter: S83.249A

## 2022-02-25 HISTORY — DX: Unspecified osteoarthritis, unspecified site: M19.90

## 2022-02-25 HISTORY — PX: KNEE ARTHROSCOPY WITH MEDIAL MENISECTOMY: SHX5651

## 2022-02-25 SURGERY — ARTHROSCOPY, KNEE, WITH MEDIAL MENISCECTOMY
Anesthesia: General | Site: Knee | Laterality: Left

## 2022-02-25 MED ORDER — OXYCODONE HCL 5 MG/5ML PO SOLN: 5.0000 mg | Freq: Once | ORAL | Status: DC | PRN

## 2022-02-25 MED ORDER — ACETAMINOPHEN 500 MG PO TABS
1000.0000 mg | ORAL_TABLET | Freq: Once | ORAL | Status: AC
  Administered 2022-02-25: 1000 mg via ORAL

## 2022-02-25 MED ORDER — FENTANYL CITRATE (PF) 100 MCG/2ML IJ SOLN
INTRAMUSCULAR | Status: AC
  Filled 2022-02-25: qty 2

## 2022-02-25 MED ORDER — MIDAZOLAM HCL 2 MG/2ML IJ SOLN
INTRAMUSCULAR | Status: AC
  Filled 2022-02-25: qty 2

## 2022-02-25 MED ORDER — ONDANSETRON HCL 4 MG/2ML IJ SOLN: 4.0000 mg | Freq: Once | INTRAMUSCULAR | Status: DC | PRN

## 2022-02-25 MED ORDER — MIDAZOLAM HCL 5 MG/5ML IJ SOLN
INTRAMUSCULAR | Status: DC | PRN
  Administered 2022-02-25: 2 mg via INTRAVENOUS

## 2022-02-25 MED ORDER — ONDANSETRON HCL 4 MG/2ML IJ SOLN
INTRAMUSCULAR | Status: DC | PRN
  Administered 2022-02-25: 4 mg via INTRAVENOUS

## 2022-02-25 MED ORDER — FENTANYL CITRATE (PF) 100 MCG/2ML IJ SOLN
INTRAMUSCULAR | Status: DC | PRN
  Administered 2022-02-25 (×2): 25 ug via INTRAVENOUS
  Administered 2022-02-25 (×2): 50 ug via INTRAVENOUS
  Administered 2022-02-25 (×2): 25 ug via INTRAVENOUS

## 2022-02-25 MED ORDER — SODIUM CHLORIDE 0.9 % IR SOLN
Status: DC | PRN
  Administered 2022-02-25: 9000 mL

## 2022-02-25 MED ORDER — DEXAMETHASONE SODIUM PHOSPHATE 4 MG/ML IJ SOLN
INTRAMUSCULAR | Status: DC | PRN
  Administered 2022-02-25: 10 mg via INTRAVENOUS

## 2022-02-25 MED ORDER — BUPIVACAINE HCL 0.5 % IJ SOLN
INTRAMUSCULAR | Status: DC | PRN
  Administered 2022-02-25: 10 mL

## 2022-02-25 MED ORDER — DEXAMETHASONE SODIUM PHOSPHATE 10 MG/ML IJ SOLN
INTRAMUSCULAR | Status: AC
  Filled 2022-02-25: qty 1

## 2022-02-25 MED ORDER — PROPOFOL 10 MG/ML IV BOLUS
INTRAVENOUS | Status: DC | PRN
  Administered 2022-02-25: 200 mg via INTRAVENOUS
  Administered 2022-02-25: 100 mg via INTRAVENOUS

## 2022-02-25 MED ORDER — FENTANYL CITRATE (PF) 100 MCG/2ML IJ SOLN
25.0000 ug | INTRAMUSCULAR | Status: DC | PRN
  Administered 2022-02-25: 25 ug via INTRAVENOUS

## 2022-02-25 MED ORDER — CLINDAMYCIN PHOSPHATE 900 MG/50ML IV SOLN
INTRAVENOUS | Status: AC
  Filled 2022-02-25: qty 50

## 2022-02-25 MED ORDER — ONDANSETRON HCL 4 MG PO TABS: 4.0000 mg | ORAL_TABLET | Freq: Three times a day (TID) | ORAL | 0 refills | Status: AC | PRN

## 2022-02-25 MED ORDER — CELECOXIB 100 MG PO CAPS: 100.0000 mg | ORAL_CAPSULE | Freq: Two times a day (BID) | ORAL | 0 refills | Status: AC

## 2022-02-25 MED ORDER — OXYCODONE HCL 5 MG PO TABS: 5.0000 mg | ORAL_TABLET | Freq: Once | ORAL | Status: DC | PRN

## 2022-02-25 MED ORDER — CELECOXIB 200 MG PO CAPS
400.0000 mg | ORAL_CAPSULE | Freq: Once | ORAL | Status: AC
  Administered 2022-02-25: 400 mg via ORAL

## 2022-02-25 MED ORDER — PROPOFOL 10 MG/ML IV BOLUS
INTRAVENOUS | Status: AC
Start: 2022-02-25 — End: ?
  Filled 2022-02-25: qty 20

## 2022-02-25 MED ORDER — ASPIRIN EC 81 MG PO TBEC: 81.0000 mg | DELAYED_RELEASE_TABLET | Freq: Two times a day (BID) | ORAL | 0 refills | Status: AC

## 2022-02-25 MED ORDER — CLINDAMYCIN PHOSPHATE 900 MG/50ML IV SOLN
900.0000 mg | INTRAVENOUS | Status: AC
  Administered 2022-02-25: 900 mg via INTRAVENOUS

## 2022-02-25 MED ORDER — CELECOXIB 200 MG PO CAPS
ORAL_CAPSULE | ORAL | Status: AC
  Filled 2022-02-25: qty 2

## 2022-02-25 MED ORDER — 0.9 % SODIUM CHLORIDE (POUR BTL) OPTIME
TOPICAL | Status: DC | PRN
  Administered 2022-02-25: 500 mL

## 2022-02-25 MED ORDER — LACTATED RINGERS IV SOLN: INTRAVENOUS | Status: DC

## 2022-02-25 MED ORDER — ACETAMINOPHEN 500 MG PO TABS: 1000.0000 mg | ORAL_TABLET | Freq: Once | ORAL | Status: AC

## 2022-02-25 MED ORDER — ONDANSETRON HCL 4 MG/2ML IJ SOLN
INTRAMUSCULAR | Status: AC
  Filled 2022-02-25: qty 2

## 2022-02-25 MED ORDER — ACETAMINOPHEN 500 MG PO TABS
ORAL_TABLET | ORAL | Status: AC
  Filled 2022-02-25: qty 2

## 2022-02-25 MED ORDER — ACETAMINOPHEN 500 MG PO TABS: 1000.0000 mg | ORAL_TABLET | Freq: Three times a day (TID) | ORAL | 0 refills | Status: AC | PRN

## 2022-02-25 MED ORDER — PROPOFOL 10 MG/ML IV BOLUS
INTRAVENOUS | Status: AC
  Filled 2022-02-25: qty 20

## 2022-02-25 MED ORDER — OXYCODONE HCL 5 MG PO TABS: 5.0000 mg | ORAL_TABLET | Freq: Four times a day (QID) | ORAL | 0 refills | Status: AC | PRN

## 2022-02-25 MED ORDER — AMISULPRIDE (ANTIEMETIC) 5 MG/2ML IV SOLN: 10.0000 mg | Freq: Once | INTRAVENOUS | Status: DC | PRN

## 2022-02-25 MED ORDER — LIDOCAINE HCL (PF) 2 % IJ SOLN
INTRAMUSCULAR | Status: AC
  Filled 2022-02-25: qty 5

## 2022-02-25 MED ORDER — LIDOCAINE HCL (CARDIAC) PF 100 MG/5ML IV SOSY
PREFILLED_SYRINGE | INTRAVENOUS | Status: DC | PRN
Start: 2022-02-25 — End: 2022-02-25
  Administered 2022-02-25: 100 mg via INTRAVENOUS

## 2022-02-25 SURGICAL SUPPLY — 34 items
APL PRP STRL LF DISP 70% ISPRP (MISCELLANEOUS) ×1
BLADE SHAVER TORPEDO 4X13 (MISCELLANEOUS) ×2 IMPLANT
BNDG ELASTIC 6X5.8 VLCR STR LF (GAUZE/BANDAGES/DRESSINGS) ×3 IMPLANT
CHLORAPREP W/TINT 26 (MISCELLANEOUS) ×3 IMPLANT
DISSECTOR  3.8MM X 13CM (MISCELLANEOUS) ×2
DISSECTOR 3.8MM X 13CM (MISCELLANEOUS) ×1 IMPLANT
DRAPE ARTHROSCOPY W/POUCH 114 (DRAPES) ×3 IMPLANT
DRAPE IMP U-DRAPE 54X76 (DRAPES) ×3 IMPLANT
DRAPE U-SHAPE 47X51 STRL (DRAPES) ×3 IMPLANT
DRAPE UTILITY XL STRL (DRAPES) ×2 IMPLANT
DRSG EMULSION OIL 3X3 NADH (GAUZE/BANDAGES/DRESSINGS) ×3 IMPLANT
DRSG PAD ABDOMINAL 8X10 ST (GAUZE/BANDAGES/DRESSINGS) ×2 IMPLANT
EXCALIBUR 3.8MM X 13CM (MISCELLANEOUS) ×2 IMPLANT
GAUZE 4X4 16PLY ~~LOC~~+RFID DBL (SPONGE) ×3 IMPLANT
GAUZE SPONGE 4X4 12PLY STRL (GAUZE/BANDAGES/DRESSINGS) ×3 IMPLANT
GLOVE SRG 8 PF TXTR STRL LF DI (GLOVE) ×1 IMPLANT
GLOVE SURG ENC MOIS LTX SZ7 (GLOVE) IMPLANT
GLOVE SURG ENC MOIS LTX SZ7.5 (GLOVE) ×3 IMPLANT
GLOVE SURG UNDER POLY LF SZ7 (GLOVE) IMPLANT
GLOVE SURG UNDER POLY LF SZ8 (GLOVE) ×3
GOWN STRL REUS W/TWL LRG LVL3 (GOWN DISPOSABLE) ×6 IMPLANT
IV NS IRRIG 3000ML ARTHROMATIC (IV SOLUTION) ×10 IMPLANT
KIT TURNOVER CYSTO (KITS) ×1 IMPLANT
MANIFOLD NEPTUNE II (INSTRUMENTS) ×3 IMPLANT
NS IRRIG 500ML POUR BTL (IV SOLUTION) ×2 IMPLANT
PACK ARTHROSCOPY DSU (CUSTOM PROCEDURE TRAY) ×3 IMPLANT
PACK BASIN DAY SURGERY FS (CUSTOM PROCEDURE TRAY) ×3 IMPLANT
PORT APPOLLO RF 90DEGREE MULTI (SURGICAL WAND) IMPLANT
SUT ETHILON 3 0 PS 1 (SUTURE) ×3 IMPLANT
TAPE CLOTH 3X10 TAN LF (GAUZE/BANDAGES/DRESSINGS) IMPLANT
TOWEL OR 17X26 10 PK STRL BLUE (TOWEL DISPOSABLE) ×3 IMPLANT
TUBING ARTHROSCOPY IRRIG 16FT (MISCELLANEOUS) ×3 IMPLANT
WATER STERILE IRR 1000ML POUR (IV SOLUTION) ×1 IMPLANT
WRAP KNEE MAXI GEL POST OP (GAUZE/BANDAGES/DRESSINGS) ×2 IMPLANT

## 2022-02-25 NOTE — Transfer of Care (Signed)
Immediate Anesthesia Transfer of Care Note ? ?Patient: Kurt Glass ? ?Procedure(s) Performed: Procedure(s) (LRB): ?KNEE ARTHROSCOPY WITH MEDIAL MENISECTOMY (Left) ? ?Patient Location: PACU ? ?Anesthesia Type: General ? ?Level of Consciousness: awake, sedated, patient cooperative and responds to stimulation ? ?Airway & Oxygen Therapy: Patient Spontanous Breathing and Patient connected to [place] 02 and soft FM  ? ?Post-op Assessment: Report given to PACU RN, Post -op Vital signs reviewed and stable and Patient moving all extremities ? ?Post vital signs: Reviewed and stable ? ?Complications: No apparent anesthesia complications ?

## 2022-02-25 NOTE — Interval H&P Note (Signed)
The patient has been re-examined, and the chart reviewed, and there have been no interval changes to the documented history and physical.   ? ?The operative side was examined and the patient was confirmed to have. Sens DPN, SPN, TN intact, Motor EHL, ext, flex 5/5, and DP 2+, PT 2+, No significant edema. ? ?Pain remains mostly medial joint line with mechanical symptoms. Feel this is coming primarily from his meniscal tear. Will address this today with a partial meniscectomy as well as diagnostic arthroscopy and chondroplasty of his trochlear defect. ? ? ?The risks, benefits, and alternatives have been discussed at length with patient, and the patient is willing to proceed.  Left knee marked. Consent has been signed. ? ?

## 2022-02-25 NOTE — Anesthesia Procedure Notes (Signed)
Procedure Name: LMA Insertion ?Date/Time: 02/25/2022 10:00 AM ?Performed by: Jessica Priest, CRNA ?Pre-anesthesia Checklist: Patient identified, Emergency Drugs available, Suction available, Patient being monitored and Timeout performed ?Patient Re-evaluated:Patient Re-evaluated prior to induction ?Oxygen Delivery Method: Circle system utilized ?Preoxygenation: Pre-oxygenation with 100% oxygen ?Induction Type: IV induction ?Ventilation: Mask ventilation without difficulty ?LMA: LMA inserted ?LMA Size: 5.0 ?Number of attempts: 1 ?Airway Equipment and Method: Bite block ?Placement Confirmation: positive ETCO2, breath sounds checked- equal and bilateral and CO2 detector ?Tube secured with: Tape ?Dental Injury: Teeth and Oropharynx as per pre-operative assessment  ? ? ? ? ?

## 2022-02-25 NOTE — Anesthesia Postprocedure Evaluation (Signed)
Anesthesia Post Note ? ?Patient: Kurt Glass ? ?Procedure(s) Performed: KNEE ARTHROSCOPY WITH MEDIAL MENISECTOMY (Left: Knee) ? ?  ? ?Patient location during evaluation: PACU ?Anesthesia Type: General ?Level of consciousness: awake ?Pain management: pain level controlled ?Vital Signs Assessment: post-procedure vital signs reviewed and stable ?Respiratory status: spontaneous breathing and respiratory function stable ?Cardiovascular status: stable ?Postop Assessment: no apparent nausea or vomiting ?Anesthetic complications: no ? ? ?No notable events documented. ? ?Last Vitals:  ?Vitals:  ? 02/25/22 1230 02/25/22 1245  ?BP: 135/65   ?Pulse: 75 67  ?Resp: (!) 21 12  ?Temp:    ?SpO2: 96% 94%  ?  ?Last Pain:  ?Vitals:  ? 02/25/22 1245  ?TempSrc:   ?PainSc: 5   ? ? ?  ?  ?  ?  ?  ?  ? ?Mellody Dance ? ? ? ? ?

## 2022-02-25 NOTE — Op Note (Signed)
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    Post-operative plan: The patient will be WBAT, no restrictions.  DVT prophylaxis Aspirin 81 mg twice daily for 4 weeks.  Pain control with PRN pain medication preferring oral medicines.  Follow up plan will be scheduled in approximately 10-14 days for incision check.  Post-Op Diagnosis: Same Surgeons:Primary: Joen Laura, MD Assistants:none Location: [facility] OR ROOM 3 Anesthesia: General with local Antibiotics: Clindamycin Tourniquet time: not used Estimated Blood Loss: Minimal Complications: None Specimens: None Implants: * No implants in log *  Indications for Surgery:   Kurt Glass is a 36 y.o. male with right medial meniscus tear and progressively worsening pain and symptoms along the medial joint line. MRI also show a moderate trochlear cartilage defect along the lateral facet. Recommended partial medial meniscectomy and chondroplasty and diagnostic evaluation of the trochlear defect. Discussed that if he continues to remain symptomatic from the patella lesion he may need additional surgery in the future allograft transfer or similar procedure to address the patella symptoms.  Benefits and risks of operative and nonoperative management were discussed prior to  surgery with patient/guardian(s) and informed consent form was completed.  Specific risks including infection, need for additional surgery, bleeding, nerve injury, blood clots, cardiopulmonary complications, morbidity, mortality, among others, and they were willing to proceed.  Procedure:   The patient was identified properly. Informed consent was obtained and the surgical site was marked. The patient was taken up to suite where general anesthesia was induced. The patient was placed in the supine position with a post against the surgical leg and a nonsterile tourniquet applied. The surgical leg was then prepped and draped usual sterile fashion.  A standard surgical timeout was performed.  2 standard anterior portals were made and diagnostic arthroscopy performed. Please note the findings as noted above.  Trochlea was only noted of partial lateral facet wear grade 1/2 overall appeared in good condition. Upon visualized the medial compartment there was a large bucket-handle medial meniscus tear in the intercondylar notch.  This could be partially reduced but had notable deformity suggesting likely chronic injury not repairable.  Using a biter this was detached from the posterior root as well as the medial attachment and removed as a single piece.  The attachments were shaved down with a shaver.  Likely greater than 50% of meniscus was removed.  Lateral compartment appeared normal without a meniscal tear.  The ACL was also assessed and felt to have a partial thickness tear of the tibial insertion.  No significant bleeding was appreciated.  The portals were closed with 3-0 nylon.  Sterile dressing was applied.  Patient was woken from anesthesia.  Taken to the recovery room in stable condition.    Weber Cooks, MD Orthopaedic Surgery

## 2022-02-25 NOTE — Anesthesia Preprocedure Evaluation (Addendum)
Anesthesia Evaluation  ?Patient identified by MRN, date of birth, ID band ?Patient awake ? ? ? ?Reviewed: ?Allergy & Precautions, Patient's Chart, lab work & pertinent test results ? ?Airway ?Mallampati: II ? ?TM Distance: >3 FB ?Neck ROM: Full ? ? ? Dental ?no notable dental hx. ? ?  ?Pulmonary ?asthma , former smoker,  ?  ?Pulmonary exam normal ?breath sounds clear to auscultation ? ? ? ? ? ? Cardiovascular ?negative cardio ROS ?Normal cardiovascular exam ?Rhythm:Regular Rate:Normal ? ? ?  ?Neuro/Psych ?negative neurological ROS ? negative psych ROS  ? GI/Hepatic ?negative GI ROS, Neg liver ROS,   ?Endo/Other  ?negative endocrine ROS ? Renal/GU ?negative Renal ROS  ?negative genitourinary ?  ?Musculoskeletal ? ?(+) Arthritis , Osteoarthritis,   ? Abdominal ?  ?Peds ?negative pediatric ROS ?(+)  Hematology ?negative hematology ROS ?(+)   ?Anesthesia Other Findings ? ? Reproductive/Obstetrics ?negative OB ROS ? ?  ? ? ? ? ? ? ? ? ? ? ? ? ? ?  ?  ? ? ? ? ? ? ? ? ?Anesthesia Physical ?Anesthesia Plan ? ?ASA: 2 ? ?Anesthesia Plan: General  ? ?Post-op Pain Management: Tylenol PO (pre-op)* and Toradol IV (intra-op)*  ? ?Induction: Intravenous ? ?PONV Risk Score and Plan: 2 and Treatment may vary due to age or medical condition, Ondansetron, Dexamethasone and Midazolam ? ?Airway Management Planned: LMA ? ?Additional Equipment: None ? ?Intra-op Plan:  ? ?Post-operative Plan: Extubation in OR ? ?Informed Consent: I have reviewed the patients History and Physical, chart, labs and discussed the procedure including the risks, benefits and alternatives for the proposed anesthesia with the patient or authorized representative who has indicated his/her understanding and acceptance.  ? ? ? ?Dental advisory given ? ?Plan Discussed with: CRNA, Anesthesiologist and Surgeon ? ?Anesthesia Plan Comments:   ? ? ? ? ? ? ?Anesthesia Quick Evaluation ? ?

## 2022-02-25 NOTE — Discharge Instructions (Addendum)
Weber Cooks, MD ?[facility] ?[address], [address] ?[phone] (tel)   ?[phone] (fax) ? ? ?POST-OPERATIVE INSTRUCTIONS - Knee Arthroscopy ? ?WOUND CARE ?- You may remove the Operative Dressing on Post-Op Day #3 (72hrs after surgery).   ?-  Alternatively if you would like you can leave dressing on until follow-up if within  but keep it dry. ?- Leave steri-strips in place until they fall off on their own, usually 2 weeks postop. ?- An ACE wrap may be used to control swelling, do not wrap this too tight.  If the initial ACE wrap feels too tight you may loosen it. ?- There may be a small amount of fluid/bleeding leaking at the surgical site.  ?- This is normal; the knee is filled with fluid during the procedure and can leak for 24-48hrs after surgery. You may change/reinforce the bandage as needed.  ?- You may shower on Post-Op Day #3. Gently pat the area dry. Do not soak the knee in water or submerge it. ?- Do not go swimming in the pool or ocean until 6 weeks after surgery or when otherwise instructed.  Keep dry incisions as dry as possible. ? ? ?BRACE/AMBULATION ?-  You can use your brace until your nerve block wears off.  At that time you can remove it. ?-            Unless otherwise specified, you will not need a brace after this procedure.   ? ?REGIONAL ANESTHESIA (NERVE BLOCKS) ?- The anesthesia team may have performed a nerve block for you if safe in the setting of your care.  This is a great tool used to minimize pain.  Typically the block may start wearing off overnight.  This can be a challenging period but please utilize your as needed pain medications to try and manage this period and know it will be a brief transition as the nerve block wears completely  ? ?POST-OP MEDICATIONS - Multimodal approach to pain control ?- In general your pain will be controlled with a combination of substances.  Prescriptions unless otherwise discussed are electronically sent to your  pharmacy.  This is a carefully made plan we use to minimize narcotic use.    ? ?- Meloxicam OR Celebrex - Anti-inflammatory medication taken on a scheduled basis ?- Acetaminophen - Non-narcotic pain medicine taken on a scheduled basis  ?- Oxycodone - This is a strong narcotic, to be used only on an "as needed" basis for pain. ?- Aspirin 81mg  - This medicine is used to minimize the risk of blood clots after surgery. ?-  Zofran - take as needed for nausea ? ?FOLLOW-UP  ?? Please call the office to schedule a follow-up appointment for your incision check, 10-14 days post-operatively. ? ?? IF YOU HAVE ANY QUESTIONS, PLEASE FEEL FREE TO CALL OUR OFFICE. ? ? ?HELPFUL INFORMATION ? ?- If you had a block, it will wear off between 8-24 hrs postop typically.  This is period when your pain may go from nearly zero to the pain you would have had post-op without the block.  This is an abrupt transition but nothing dangerous is happening.  You may take an extra dose of narcotic when this happens. ? ?? Keep your leg elevated to decrease swelling, which will then in turn decrease your pain. I would elevate the foot of your bed by putting a couple of couch pillows between your mattress and box spring. I would not keep pillow directly under your ankle. ? ?-  Do not sleep with a pillow behind your knee even if it is more comfortable as this may make it harder to get your knee fully straight long term. ? ?? There will be MORE swelling on days 1-3 than there is on the day of surgery.  This also is normal. The swelling will decrease with the anti-inflammatory medication, ice and keeping it elevated. The swelling will make it more difficult to bend your knee. As the swelling goes down your motion will become easier ? ?? You may develop swelling and bruising that extends from your knee down to your calf and perhaps even to your foot over the next week. Do not be alarmed. This too is normal, and it is due to gravity ? ?? There may be some  numbness adjacent to the incision site. This may last for 6-12 months or longer in some patients and is expected. ? ?? You may return to sedentary work/school in the next couple of days when you feel up to it. You will need to keep your leg elevated as much as possible  ? ?? You should wean off your narcotic medicines as soon as you are able.  Most patients will be off or using minimal narcotics before their first postop appointment.  ? ?? We suggest you use the pain medication the first night prior to going to bed, in order to ease any pain when the anesthesia wears off. You should avoid taking pain medications on an empty stomach as it will make you nauseous. ? ?? Do not drink alcoholic beverages or take illicit drugs when taking pain medications. ? ?? It is against the law to drive while taking narcotics. You cannot drive if your Right leg is in brace locked in extension. ? ?? Pain medication may make you constipated.  Below are a few solutions to try in this order: ? o Decrease the amount of pain medication if you aren't having pain. ? o Drink lots of decaffeinated fluids. ? o Drink prune juice and/or eat dried prunes ? ? o If the first 3 don't work start with additional solutions ? o Take Colace - an over-the-counter stool softener ? o Take Senokot - an over-the-counter laxative ? o Take Miralax - a stronger over-the-counter laxative ? ? ? ?For more information including helpful videos and documents visit our website:  ? ?https://www.[facility].com/patient-information.html ? ?  ? ? ? ? ?Take your next dose of tylenol at 5:00pm today ? ?Begin your celebrex dose in the morning 02/26/22 ? ? ? ?Post Anesthesia Home Care Instructions ? ?Activity: ?Get plenty of rest for the remainder of the day. A responsible individual must stay with you for 24 hours following the procedure.  ?For the next 24 hours, DO NOT: ?-Drive a car ?-Advertising copywriter ?-Drink alcoholic beverages ?-Take any medication unless instructed by your  physician ?-Make any legal decisions or sign important papers. ? ?Meals: ?Start with liquid foods such as gelatin or soup. Progress to regular foods as tolerated. Avoid greasy, spicy, heavy foods. If nausea and/or vomiting occur, drink only clear liquids until the nausea and/or vomiting subsides. Call your physician if vomiting continues. ? ?Special Instructions/Symptoms: ?Your throat may feel dry or sore from the anesthesia or the breathing tube placed in your throat during surgery. If this causes discomfort, gargle with warm salt water. The discomfort should disappear within 24 hours. ? ?If you had a scopolamine patch placed behind your ear for the management of post- operative nausea and/or vomiting: ? ?  1. The medication in the patch is effective for 72 hours, after which it should be removed.  Wrap patch in a tissue and discard in the trash. Wash hands thoroughly with soap and water. ?2. You may remove the patch earlier than 72 hours if you experience unpleasant side effects which may include dry mouth, dizziness or visual disturbances. ?3. Avoid touching the patch. Wash your hands with soap and water after contact with the patch. ?    ? ?

## 2022-02-28 ENCOUNTER — Encounter (HOSPITAL_BASED_OUTPATIENT_CLINIC_OR_DEPARTMENT_OTHER): Payer: Self-pay | Admitting: Orthopedic Surgery

## 2022-12-05 ENCOUNTER — Other Ambulatory Visit: Payer: Self-pay

## 2022-12-05 ENCOUNTER — Emergency Department (HOSPITAL_BASED_OUTPATIENT_CLINIC_OR_DEPARTMENT_OTHER)
Admission: EM | Admit: 2022-12-05 | Discharge: 2022-12-05 | Disposition: A | Discharge status: Home / Self Care | Payer: BC Managed Care – PPO | Attending: Emergency Medicine | Admitting: Emergency Medicine

## 2022-12-05 ENCOUNTER — Encounter (HOSPITAL_BASED_OUTPATIENT_CLINIC_OR_DEPARTMENT_OTHER): Payer: Self-pay | Admitting: Emergency Medicine

## 2022-12-05 DIAGNOSIS — Z20822 Contact with and (suspected) exposure to covid-19: Secondary | ICD-10-CM | POA: Insufficient documentation

## 2022-12-05 DIAGNOSIS — J020 Streptococcal pharyngitis: Secondary | ICD-10-CM | POA: Insufficient documentation

## 2022-12-05 LAB — RESP PANEL BY RT-PCR (RSV, FLU A&B, COVID)  RVPGX2
Influenza A by PCR: NEGATIVE
Influenza B by PCR: NEGATIVE
Resp Syncytial Virus by PCR: NEGATIVE
SARS Coronavirus 2 by RT PCR: NEGATIVE

## 2022-12-05 LAB — GROUP A STREP BY PCR: Group A Strep by PCR: NOT DETECTED

## 2022-12-05 MED ORDER — IBUPROFEN 400 MG PO TABS
600.0000 mg | ORAL_TABLET | Freq: Once | ORAL | Status: AC
  Administered 2022-12-05: 600 mg via ORAL
  Filled 2022-12-05: qty 1

## 2022-12-05 MED ORDER — CEFDINIR 300 MG PO CAPS: 300.0000 mg | ORAL_CAPSULE | Freq: Two times a day (BID) | ORAL | 0 refills | Status: AC

## 2022-12-05 MED ORDER — CEFDINIR 300 MG PO CAPS
300.0000 mg | ORAL_CAPSULE | Freq: Two times a day (BID) | ORAL | Status: DC
  Administered 2022-12-05: 300 mg via ORAL
  Filled 2022-12-05: qty 1

## 2022-12-05 MED ORDER — AZITHROMYCIN 250 MG PO TABS
500.0000 mg | ORAL_TABLET | Freq: Once | ORAL | Status: DC
  Filled 2022-12-05: qty 2

## 2022-12-05 MED ORDER — CEFIXIME 400 MG PO CAPS: 400.0000 mg | ORAL_CAPSULE | Freq: Every day | ORAL | 0 refills | Status: DC

## 2022-12-05 MED ORDER — CEFIXIME 400 MG PO CAPS: 400.0000 mg | ORAL_CAPSULE | Freq: Once | ORAL | Status: DC

## 2022-12-05 NOTE — ED Provider Notes (Addendum)
[facility] EMERGENCY DEPT Provider Note   CSN: [id number] Arrival date & time: 12/05/22  1339     History  No chief complaint on file.  HPI Kurt Glass is a 36 y.o. male presented for sore throat.  Symptoms started this past Wednesday.  Patient having difficulty swallowing but still able to drink.  States he had a low-grade fever but no cough.  Mention that his girlfriend has had similar symptoms.  Taking no medications to alleviate symptoms.  Denies trouble breathing.  HPI     Home Medications Prior to Admission medications   Medication Sig Start Date End Date Taking? Authorizing Provider  cefdinir (OMNICEF) 300 MG capsule Take 1 capsule (300 mg total) by mouth 2 (two) times daily for 10 days. 12/05/22 12/15/22 Yes Gareth Eagle, PA-C  clindamycin (CLEOCIN) 150 MG capsule Take 300 mg by mouth 4 (four) times daily. Started on 02-06-2022 for tooth extraction with abcess pulled on 02-06-2022, has 6 left as of 02-22-2022    [provider]      Allergies    Penicillins    Review of Systems   Review of Systems  HENT:  Positive for sore throat.     Physical Exam Updated Vital Signs BP (!) 140/101   Pulse 84   Temp (!) 97.3 F (36.3 C) (Temporal)   Resp 18   SpO2 97%  Physical Exam Constitutional:      Appearance: Normal appearance.  HENT:     Head: Normocephalic.     Nose: Nose normal.     Mouth/Throat:     Pharynx: Uvula midline. Pharyngeal swelling, oropharyngeal exudate and posterior oropharyngeal erythema present.  Eyes:     Conjunctiva/sclera: Conjunctivae normal.  Pulmonary:     Effort: Pulmonary effort is normal.  Neurological:     Mental Status: He is alert.  Psychiatric:        Mood and Affect: Mood normal.     ED Results / Procedures / Treatments   Labs (all labs ordered are listed, but only abnormal results are displayed) Labs Reviewed  RESP PANEL BY RT-PCR (RSV, FLU A&B, COVID)  RVPGX2  GROUP A STREP BY PCR     EKG None  Radiology No results found.  Procedures Procedures    Medications Ordered in ED Medications  cefdinir (OMNICEF) capsule 300 mg (has no administration in time range)  ibuprofen (ADVIL) tablet 600 mg (600 mg Oral Given 12/05/22 1752)    ED Course/ Medical Decision Making/ A&P                           Medical Decision Making Risk Prescription drug management.   36 year old male who is well-appearing, nontoxic and otherwise hemodynamically stable presenting for sore throat.  Exam notable for erythema and swelling of the posterior pharynx is along with tonsillar exudate.  Differential diagnosis for this complaint includes strep pharyngitis, peritonsillar abscess and retropharyngeal abscess.  Symptoms not consistent with retropharyngeal or peritonsillar abscess.  PCR was negative for strep but given that Sam revealed concerns for strep infection in setting of no cough and girlfriend was diagnosed with strep throat today I decided it is appropriate to treat him as well for strep pharyngitis.  Patient was concerned about being treated with amoxicillin given rash that he had as a child after starting the medication.  Treated with omnicef.  Treated pain with ibuprofen.  Advised patient to follow-up with PCP if symptoms persisted.  Sent  the remaining course of his cefixime to his pharmacy.  Discussed return precautions.         Final Clinical Impression(s) / ED Diagnoses Final diagnoses:  Strep pharyngitis    Rx / DC Orders ED Discharge Orders          Ordered    cefixime (SUPRAX) 400 MG CAPS capsule  Daily,   Status:  Discontinued        12/05/22 1720    cefdinir (OMNICEF) 300 MG capsule  2 times daily        12/05/22 1806              Gareth Eagle, PA-C 12/05/22 1734    Gareth Eagle, PA-C 12/05/22 1807    Benjiman Core, MD 12/05/22 [phone]

## 2022-12-05 NOTE — ED Triage Notes (Signed)
Pt here from home with c/o sore throat  cogh and congestion , been ongoing for a few days

## 2022-12-05 NOTE — Discharge Instructions (Addendum)
You are diagnosed with strep throat.  Recommend that he continue taking Omnicef which is an antibiotic that will treat your infection.  Recommend that you follow-up with your PCP if your symptoms persist.  If you have inability to swallow, trouble breathing, drooling please return to the emergency department for the further evaluation.

## 2024-11-01 ENCOUNTER — Encounter (HOSPITAL_BASED_OUTPATIENT_CLINIC_OR_DEPARTMENT_OTHER): Payer: Self-pay

## 2024-11-01 ENCOUNTER — Emergency Department (HOSPITAL_BASED_OUTPATIENT_CLINIC_OR_DEPARTMENT_OTHER)
Admission: EM | Admit: 2024-11-01 | Discharge: 2024-11-01 | Disposition: A | Discharge status: Home / Self Care | Payer: Self-pay | Attending: Emergency Medicine | Admitting: Emergency Medicine

## 2024-11-01 DIAGNOSIS — G44209 Tension-type headache, unspecified, not intractable: Secondary | ICD-10-CM | POA: Insufficient documentation

## 2024-11-01 DIAGNOSIS — I10 Essential (primary) hypertension: Secondary | ICD-10-CM | POA: Insufficient documentation

## 2024-11-01 NOTE — ED Provider Notes (Signed)
 [place] EMERGENCY DEPARTMENT AT [facility] Provider Note   CSN: [id number] Arrival date & time: 11/01/24  1103     Patient presents with: Headache   Kurt Glass is a 38 y.o. male with history of hypertension, presents with concern for left-sided temporal headache that started yesterday.  He reports he took Excedrin last night and woke up with the headache feeling better.  He presented today to try and get a prescription for Excedrin since the thought this was a prescription only medication.  He did not know that this is over-the-counter.  He denies any head trauma.  He is not on any anticoagulation.  No neck stiffness, neck pain, fever, or chills.  Denies any nausea, vomiting, photosensitivity.    Headache      Prior to Admission medications   Medication Sig Start Date End Date Taking? Authorizing Provider  clindamycin  (CLEOCIN ) 150 MG capsule Take 300 mg by mouth 4 (four) times daily. Started on 02-06-2022 for tooth extraction with abcess pulled on 02-06-2022, has 6 left as of 02-22-2022    [provider]    Allergies: Penicillins    Review of Systems  Neurological:  Positive for headaches.    Updated Vital Signs BP 131/82 (BP Location: Right Arm)   Pulse 78   Temp 98 F (36.7 C)   Resp 16   SpO2 96%   Physical Exam Vitals and nursing note reviewed.  Constitutional:      Appearance: Normal appearance.  HENT:     Head: Normocephalic and atraumatic.  Eyes:     Extraocular Movements: Extraocular movements intact.     Conjunctiva/sclera: Conjunctivae normal.     Pupils: Pupils are equal, round, and reactive to light.  Cardiovascular:     Rate and Rhythm: Normal rate and regular rhythm.  Pulmonary:     Effort: Pulmonary effort is normal.  Musculoskeletal:     Cervical back: Normal range of motion. No rigidity.  Neurological:     General: No focal deficit present.     Mental Status: He is alert and oriented to person, place, and time.   Psychiatric:        Mood and Affect: Mood normal.        Behavior: Behavior normal.     (all labs ordered are listed, but only abnormal results are displayed) Labs Reviewed - No data to display  EKG: None  Radiology: No results found.   Procedures   Medications Ordered in the ED - No data to display                                  Medical Decision Making    Differential diagnosis includes but is not limited to Tension headache, migraine, temporal arteritis, trigeminal neuralgia, cluster headache, meningitis, concussion, intracranial mass, intracranial hemorrhage, carbon monoxide poisoning, sinus venous thrombosis   ED Course:  Upon initial evaluation, patient is very well-appearing, no acute distress.  Normal vitals.  He presents today due to left-sided temporal headache that he had yesterday and was requesting Excedrin since this helped resolve his headache yesterday.  He does not have any headache currently.  This headache was consistent with his normal headaches.  He denies any head trauma, no nausea or vomiting, no concern for intracranial hemorrhage or concussion.  He denies any neck stiffness, neck pain, fevers, no concern for meningitis.  No indication for head imaging.  He does not want  any further evaluation or treatment in the emergency room as he just wanted a prescription for Excedrin.  Stable and appropriate for discharge home.  I informed the patient that Excedrin is over-the-counter and he can pick this up at any drugstore.  Impression: Headache, resolved with home Excedrin  Disposition:  The patient was discharged home with instructions to take Excedrin as needed according to package directions.  Follow-up with PCP if he continues to have frequent headaches or headaches not controlled with home medications. Return precautions given and patient verbalized understanding.     This chart was dictated using voice recognition software, Dragon. Despite the best  efforts of this provider to proofread and correct errors, errors may still occur which can change documentation meaning.       Final diagnoses:  Acute non intractable tension-type headache    ED Discharge Orders     None          Veta Palma, [place] 11/01/24 1148    Yolande Lamar BROCKS, MD 11/02/24 [phone]

## 2024-11-01 NOTE — Discharge Instructions (Signed)
 You were seen today for your headache No specific cause was found today for your headache. It may have been a migraine or other cause of headache. Stress, anxiety, fatigue, weather changes are common triggers for headaches.   You may use Excedrin according to package directions. Do not take this if you are taking tylenol  and/or ibuprofen .   If you do not have Excedrin on hand, you may take:  You may take up to 1000mg  of tylenol  every 6 hours as needed for headache. Do not take more then 4g per day.   You may use up to 600mg  ibuprofen  every 6 hours as needed for headache.  Do not exceed 2.4g of ibuprofen  per day.   Follow-up instructions: Please follow-up with your primary care provider if your headaches are not being controlled with over-the-counter medications like Tylenol  and ibuprofen  or becoming more frequent.  Return instructions:  Please return to the Emergency Department if you experience worsening symptoms. Return if the medications do not resolve your headache, if it recurs, or if you have multiple episodes of vomiting or cannot keep down fluids. Return if you have a change from your usual headache. RETURN IMMEDIATELY IF you: Develop a sudden, severe headache Develop confusion or become poorly responsive or faint Develop a fever above 100.39F or problem breathing Have a change in speech, vision, swallowing, or understanding Develop new weakness, numbness, tingling, incoordination in your arms or legs Have a seizure Have lots of pain in your neck Please return if you have any other emergent concerns.

## 2024-11-01 NOTE — ED Triage Notes (Signed)
 Patient reports headache for 2 days. Says taking Excedrin and it worked yesterday, but today he said it came back and it is slight. He is concerned it is from his blood pressure medication which he has been taking for 4 months.
